# Patient Record
Sex: Female | Born: 1990
Health system: Southern US, Community
[De-identification: ages and names within clinical notes are randomized; demographics above are authoritative.]

## PROBLEM LIST (undated history)

## (undated) ENCOUNTER — Inpatient Hospital Stay (HOSPITAL_COMMUNITY): Payer: Self-pay

## (undated) DIAGNOSIS — Z789 Other specified health status: Secondary | ICD-10-CM

## (undated) HISTORY — PX: WISDOM TOOTH EXTRACTION: SHX21

---
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
## 2010-05-12 ENCOUNTER — Ambulatory Visit: Payer: Self-pay | Admitting: Nurse Practitioner

## 2010-05-12 ENCOUNTER — Inpatient Hospital Stay (HOSPITAL_COMMUNITY)
Admission: AD | Admit: 2010-05-12 | Discharge: 2010-05-12 | Payer: Self-pay | Source: Home / Self Care | Admitting: Obstetrics & Gynecology

## 2010-05-17 ENCOUNTER — Ambulatory Visit: Payer: Self-pay | Admitting: Nurse Practitioner

## 2010-05-17 ENCOUNTER — Ambulatory Visit (HOSPITAL_COMMUNITY)
Admission: RE | Admit: 2010-05-17 | Discharge: 2010-05-17 | Payer: Self-pay | Source: Home / Self Care | Admitting: Obstetrics & Gynecology

## 2010-11-29 ENCOUNTER — Inpatient Hospital Stay (HOSPITAL_COMMUNITY)
Admission: AD | Admit: 2010-11-29 | Discharge: 2010-11-29 | Disposition: A | Discharge status: Home / Self Care | Payer: Medicaid Other | Source: Ambulatory Visit | Attending: Obstetrics | Admitting: Obstetrics

## 2010-11-29 DIAGNOSIS — O99891 Other specified diseases and conditions complicating pregnancy: Secondary | ICD-10-CM | POA: Insufficient documentation

## 2010-11-29 DIAGNOSIS — N949 Unspecified condition associated with female genital organs and menstrual cycle: Secondary | ICD-10-CM | POA: Insufficient documentation

## 2010-11-29 DIAGNOSIS — J069 Acute upper respiratory infection, unspecified: Secondary | ICD-10-CM | POA: Insufficient documentation

## 2010-11-29 LAB — URINALYSIS, ROUTINE W REFLEX MICROSCOPIC
Bilirubin Urine: NEGATIVE
Hgb urine dipstick: NEGATIVE
Ketones, ur: 40 mg/dL — AB
Nitrite: NEGATIVE
Protein, ur: NEGATIVE mg/dL
Specific Gravity, Urine: 1.015 (ref 1.005–1.030)
Urine Glucose, Fasting: NEGATIVE mg/dL
Urobilinogen, UA: 2 mg/dL — ABNORMAL HIGH (ref 0.0–1.0)
pH: 7 (ref 5.0–8.0)

## 2010-11-29 LAB — WET PREP, GENITAL
Clue Cells Wet Prep HPF POC: NONE SEEN
Trich, Wet Prep: NONE SEEN
Yeast Wet Prep HPF POC: NONE SEEN

## 2010-11-30 LAB — GC/CHLAMYDIA PROBE AMP, URINE
Chlamydia, Swab/Urine, PCR: NEGATIVE
GC Probe Amp, Urine: NEGATIVE

## 2010-12-24 LAB — URINALYSIS, ROUTINE W REFLEX MICROSCOPIC
Bilirubin Urine: NEGATIVE
Glucose, UA: NEGATIVE mg/dL
Hgb urine dipstick: NEGATIVE
Ketones, ur: NEGATIVE mg/dL
Nitrite: NEGATIVE
Protein, ur: NEGATIVE mg/dL
Specific Gravity, Urine: 1.02 (ref 1.005–1.030)
Urobilinogen, UA: 2 mg/dL — ABNORMAL HIGH (ref 0.0–1.0)
pH: 7.5 (ref 5.0–8.0)

## 2010-12-24 LAB — CBC
HCT: 37.3 % (ref 36.0–46.0)
Hemoglobin: 12.3 g/dL (ref 12.0–15.0)
MCH: 29.2 pg (ref 26.0–34.0)
MCHC: 33.1 g/dL (ref 30.0–36.0)
MCV: 88.4 fL (ref 78.0–100.0)
Platelets: 236 10*3/uL (ref 150–400)
RBC: 4.22 MIL/uL (ref 3.87–5.11)
RDW: 12.7 % (ref 11.5–15.5)
WBC: 5.8 10*3/uL (ref 4.0–10.5)

## 2010-12-24 LAB — GC/CHLAMYDIA PROBE AMP, GENITAL
Chlamydia, DNA Probe: NEGATIVE
GC Probe Amp, Genital: NEGATIVE

## 2010-12-24 LAB — ABO/RH: ABO/RH(D): A POS

## 2010-12-24 LAB — URINE MICROSCOPIC-ADD ON

## 2010-12-24 LAB — URINE CULTURE
Colony Count: >=100000
Culture  Setup Time: 201108040247

## 2010-12-24 LAB — WET PREP, GENITAL
Trich, Wet Prep: NONE SEEN
Yeast Wet Prep HPF POC: NONE SEEN

## 2010-12-24 LAB — POCT PREGNANCY, URINE: Preg Test, Ur: POSITIVE

## 2010-12-24 LAB — HCG, QUANTITATIVE, PREGNANCY: hCG, Beta Chain, Quant, S: 5685 m[IU]/mL — ABNORMAL HIGH (ref <5)

## 2011-01-06 ENCOUNTER — Inpatient Hospital Stay (HOSPITAL_COMMUNITY)
Admission: AD | Admit: 2011-01-06 | Discharge: 2011-01-08 | DRG: 766 | Disposition: A | Discharge status: Home / Self Care | Payer: Medicaid Other | Source: Ambulatory Visit | Attending: Obstetrics | Admitting: Obstetrics

## 2011-01-06 DIAGNOSIS — O34219 Maternal care for unspecified type scar from previous cesarean delivery: Principal | ICD-10-CM | POA: Diagnosis present

## 2011-01-06 LAB — CBC
HCT: 35.6 % — ABNORMAL LOW (ref 36.0–46.0)
Hemoglobin: 11.5 g/dL — ABNORMAL LOW (ref 12.0–15.0)
MCH: 27.9 pg (ref 26.0–34.0)
MCHC: 32.3 g/dL (ref 30.0–36.0)
MCV: 86.4 fL (ref 78.0–100.0)
Platelets: 179 10*3/uL (ref 150–400)
RBC: 4.12 MIL/uL (ref 3.87–5.11)
RDW: 13.7 % (ref 11.5–15.5)
WBC: 8.9 10*3/uL (ref 4.0–10.5)

## 2011-01-06 LAB — URINALYSIS, ROUTINE W REFLEX MICROSCOPIC
Bilirubin Urine: NEGATIVE
Glucose, UA: NEGATIVE mg/dL
Ketones, ur: NEGATIVE mg/dL
Nitrite: NEGATIVE
Protein, ur: NEGATIVE mg/dL
Specific Gravity, Urine: 1.01 (ref 1.005–1.030)
Urobilinogen, UA: 0.2 mg/dL (ref 0.0–1.0)
pH: 6 (ref 5.0–8.0)

## 2011-01-06 LAB — URINE MICROSCOPIC-ADD ON

## 2011-01-07 LAB — CBC
HCT: 29.9 % — ABNORMAL LOW (ref 36.0–46.0)
Hemoglobin: 9.6 g/dL — ABNORMAL LOW (ref 12.0–15.0)
MCH: 27.7 pg (ref 26.0–34.0)
MCHC: 32.1 g/dL (ref 30.0–36.0)
MCV: 86.4 fL (ref 78.0–100.0)
Platelets: 150 10*3/uL (ref 150–400)
RBC: 3.46 MIL/uL — ABNORMAL LOW (ref 3.87–5.11)
RDW: 13.7 % (ref 11.5–15.5)
WBC: 11.4 10*3/uL — ABNORMAL HIGH (ref 4.0–10.5)

## 2011-01-07 LAB — RPR: RPR Ser Ql: NONREACTIVE

## 2011-01-12 NOTE — Op Note (Signed)
  NAMEKELLYJO, Lori Jackson             ACCOUNT NO.:  [id number]  MEDICAL RECORD NO.:  [id number]           PATIENT TYPE:  I  LOCATION:  9133                          [facility]  PHYSICIAN:  Kathreen Cosier, M.D.DATE OF BIRTH:  03/11/1991  DATE OF PROCEDURE: DATE OF DISCHARGE:                              OPERATIVE REPORT   PREOPERATIVE DIAGNOSIS:  Previous cesarean section at term in labor, desires repeat.  POSTOPERATIVE DIAGNOSIS:  Previous cesarean section at term in labor, desires repeat.  SURGEON:  Kathreen Cosier, MD  ANESTHESIA:  Spinal anesthesia.  PROCEDURE:  The patient placed on the operating table in supine position after the spinal administered, the abdomen was prepped and draped, bladder was emptied with Foley catheter.  Transverse suprapubic incision was made through the old scar, carried down to the rectus fascia. Fascia cleaned and incised the length of the incision.  Recti muscles retracted laterally.  Peritoneum incised longitudinally.  Transverse incision was made in the visceral peritoneum above the bladder.  Bladder was mobilized inferiorly.  Transverse lower uterine incision was made. Fluid cleared.  The patient delivered from the LOA position of a female Apgar of 9 and 9, weighing 7 pounds 6 ounces.  Placenta was fundal, removed manually and sent to Labor and Delivery.  Team was in attendance.  The uterine cavity was cleaned with dry laps.  Uterine incision was closed in one layer with continuous suture of #1 chromic. Hemostasis was satisfactory.  Bladder flap reattached with 2-0 chromic. Uterus well contracted.  Tubes and ovaries were normal.  Abdomen was closed in layers, peritoneum continuous suture of O chromic, fascia continuous suture with Dexon, skin closed with subcuticular stitch of 4- 0 Monocryl.  Blood loss was 600 mL.  The patient tolerated procedure well and taken to recovery room in good condition.     ______________________________ Kathreen Cosier, M.D.     BAM/MEDQ  D:  01/06/2011  T:  01/07/2011  Job:  [id number]  Electronically Signed by Francoise Ceo M.D. on 01/12/2011 08:12:35 AM

## 2011-01-12 NOTE — Discharge Summary (Signed)
  NAMECASADY, Lori Jackson             ACCOUNT NO.:  [id number]  MEDICAL RECORD NO.:  [id number]           PATIENT TYPE:  I  LOCATION:  9133                          [facility]  [facility]  PHYSICIAN:  Roseanna Rainbow, M.D.DATE OF BIRTH:  02/12/1991  DATE OF ADMISSION:  01/06/2011 DATE OF DISCHARGE:  01/08/2011                              DISCHARGE SUMMARY   CHIEF COMPLAINT:  The patient is a 20 year old gravida 2, para 0-1-0-1 with an EDC of April 9 with a history of previous cesarean delivery, complaining of contractions.  HISTORY OF PRESENT ILLNESS:  Please see the above.  The patient has a history of previous cesarean delivery for a placental abruption.  She declines a trial of labor.  PAST MEDICAL HISTORY:  She denies.  PAST GYN HISTORY:  Noncontributory.  PAST OB HISTORY:  Please see the above.  FAMILY HISTORY:  Noncontributory.  SOCIAL HISTORY:  She denies any tobacco, ethanol, or drug use.  ALLERGIES:  CODEINE.  MEDICATIONS:  Please see the medication reconciliation form.  REVIEW OF SYSTEMS:  GU:  Please see the above.  PHYSICAL EXAMINATION:  VITAL SIGNS:  Stable, afebrile. GENERAL:  Moderate distress. HEAD, EYES, EARS, NOSE AND THROAT:  Normocephalic and atraumatic. NECK:  Supple. LUNGS:  Clear to auscultation bilaterally. HEART:  Regular rate and rhythm. ABDOMEN:  Gravid. PELVIC:  Per the RN. EXTREMITIES:  No clubbing, cyanosis, or edema. SKIN:  Without rash. NEUROLOGIC:  Nonfocal.  ASSESSMENT:  History of a previous cesarean delivery, intrauterine pregnancy at term, threatened labor, desires a repeat cesarean delivery.  PLAN:  Admission, repeat cesarean delivery.  HOSPITAL COURSE:  The patient was admitted and underwent repeat cesarean delivery.  Please see the dictated operative summary for further details.  Postoperative day #1, hemoglobin was 9.6, preoperative hemoglobin was 11.5.  The remainder of the patient's hospital course  was uneventful.  DISCHARGE DIAGNOSES:  Intrauterine pregnancy at term, threatened labor, history of previous cesarean delivery, declines trial of labor.  PROCEDURE:  Repeat cesarean delivery.  CONDITION:  Good.  DIET:  Regular.  ACTIVITY:  Pelvic rest, progressive activity.  MEDICATIONS:  Percocet 5/325 1-2 tablets every 6 hours as needed and prenatal vitamins.  DISPOSITION:  The patient is to follow up in the office with Dr. Gaynell Face.     Roseanna Rainbow, M.D.     [name]  D:  01/08/2011  T:  01/09/2011  Job:  [id number]  cc:   Kathreen Cosier, M.D. Fax: [phone]  Electronically Signed by Antionette Char M.D. on 01/12/2011 10:12:55 PM

## 2011-01-30 ENCOUNTER — Inpatient Hospital Stay (INDEPENDENT_AMBULATORY_CARE_PROVIDER_SITE_OTHER)
Admission: RE | Admit: 2011-01-30 | Discharge: 2011-01-30 | Disposition: A | Discharge status: Home / Self Care | Payer: Medicaid Other | Source: Ambulatory Visit | Attending: Emergency Medicine | Admitting: Emergency Medicine

## 2011-01-30 DIAGNOSIS — J069 Acute upper respiratory infection, unspecified: Secondary | ICD-10-CM

## 2011-01-30 LAB — POCT INFECTIOUS MONO SCREEN: Mono Screen: NEGATIVE

## 2011-01-30 LAB — POCT RAPID STREP A (OFFICE): Streptococcus, Group A Screen (Direct): NEGATIVE

## 2011-02-25 NOTE — Op Note (Signed)
NAMECAYLOR, CERINO             ACCOUNT NO.:  [id number]   MEDICAL RECORD NO.:  [id number]          PATIENT TYPE:  MAT   LOCATION:  MATC                          [facility]:  [facility]   PHYSICIAN:  Naima A. Dillard, M.D. DATE OF BIRTH:  January 19, 1991   DATE OF PROCEDURE:  01/20/2007  DATE OF DISCHARGE:                               OPERATIVE REPORT   PREOPERATIVE DIAGNOSIS:  Intrauterine pregnancy at 29-6/7 weeks with  placental abruption.   POSTOPERATIVE DIAGNOSIS:  Intrauterine pregnancy at 29-6/7 weeks with  placental abruption.   PROCEDURE:  Primary low transverse cesarean section.   SURGEON:  Dr. Normand Sloop   ASSISTANT:  Philipp Deputy, C.N.M.   ANESTHESIA:  Spinal.   ESTIMATED BLOOD LOSS:  1 L.   URINE OUTPUT:  75 mL clear urine at the end of the procedure.   IV FLUIDS:  4 L.   COMPLICATIONS:  None.  Placenta was sent to pathology.   FINDINGS:  A female infant, vertex presentation with nuchal cord x1,  clear fluid.  Apgars were 2 and 7.  Arterial pH was 6.99, and venous pH  was 7.05.  There was large clot along the placenta and inside the uterus  consistent with placenta abruption.  There was normal-appearing uterus,  tubes, and ovaries and abdominal anatomy.   PROCEDURE IN DETAIL:  The patient was taken to the operating room where  she was given spinal anesthesia, placed in the dorsal supine position  and then prepped and draped in a normal sterile fashion.  A Foley  catheter had been placed already before she was given a spinal.  Once  the patient was tested and her spinal anesthesia was found to be  adequate, a Pfannenstiel skin incision was made with a scalpel and  carried down to the fascia using Bovie cautery.  The fascia was incised  in the midline and extended bilaterally using Mayo scissors.  Kochers x2  were placed in the superior aspect of the fascia which was dissected off  the rectus muscle both bluntly and sharply.  The inferior aspect of the  fascia was dissected  in a similar fashion.  The muscles were separated  in the midline; peritoneum was identified, tented up, and entered  sharply and extended superiorly and inferiorly with good visualization  of bowel and bladder.  Vesicouterine peritoneum was identified, tented  up and entered sharply and extended bilaterally.  The bladder blade was  inserted.  A primary lower transverse cesarean section primary low  transverse uterine incision was made with a scalpel and extended  bluntly.  Clear fluid noted once the amniotic sac was interrupted with  Allis clamps.  The head was delivered without difficulty.  The nuchal  cord x1 was reduced.  The body was delivered.  The cord was clamped and  cut and handed over the awaiting pediatricians.  Cord blood and arterial  and venous pH were collected.  Would expect to have large clots  elongating the uterus and several large clots extending from the uterus.  The uterus was cleared of all clot and debris.  Uterine incision was  repaired with  0 Vicryl in a running lock fashion.  A second layer of 0  Vicryl was used to imbricate the uterus. Irrigation was done.  All areas  were noted to be hemostatic.  The patient had normal-appearing tubes and  ovaries.  The peritoneum was closed with 0 chromic in a running fashion.  The fascia was closed with 0 Vicryl in a running fashion.  The skin was  reapproximated with 3-0 Monocryl in a subcuticular fashion.  Sponge,  lap, and needle counts were correct.  The patient went to recovery room  in stable condition.      Naima A. Normand Sloop, M.D.  Electronically Signed     NAD/MEDQ  D:  01/20/2007  T:  01/20/2007  Job:  [id number]

## 2011-02-25 NOTE — H&P (Signed)
Lori Jackson, Lori Jackson             ACCOUNT NO.:  [id number]   MEDICAL RECORD NO.:  [id number]          PATIENT TYPE:  MAT   LOCATION:  MATC                          [facility]   PHYSICIAN:  Naima A. Dillard, M.D. DATE OF BIRTH:  12-13-1990   DATE OF ADMISSION:  01/20/2007  DATE OF DISCHARGE:                              HISTORY & PHYSICAL   Lori Jackson is a 20 year old primigravida at 29-6/7 weeks who presents  unannounced with lower abdominal pain since 1:00 a.m.  She reports  intercourse yesterday at 7:00 p.m.  She reports positive fetal movement.  No leaking, no bleeding.  Her pregnancy has been followed by the [facility] service has been remarkable for:  1. Teen.  2. First trimester gonorrhea.  3. First trimester UTI.   She had one episode of vomiting since arriving at maternity admissions.  Her prenatal labs were collected on October 18, 2006.  Hemoglobin 11.8,  hematocrit 34.0, platelets 263,000.  Blood type A+, antibody negative.  Sickle cell trait negative, rubella immune, RPR nonreactive, hepatitis B  surface antigen negative, HIV nonreactive.  Cystic fibrosis negative.  Gonorrhea and chlamydia on this date were both negative, although she  had a positive GC on August 02, 2006 that was treated.   HISTORY OF PRESENT PREGNANCY:  The patient presented for care at [facility] on October 18, 2006 at 16-1/[redacted] weeks gestation.  She had  initially been seen by a different practice but had to leave and  transfer due to insurance issues.  Pregnancy ultrasonography at 19  weeks' gestation shows growth consistent with previous dating,  confirming [facility] of April 01, 2007.  All anatomy was seen.  She had a lump  in her right breast at the 6 o'clock position that she reports being  there for several years.  It was diagnosed as a cyst, but she did not  have an ultrasound Jackson.  She was referred for a breast ultrasound.  At  this point, no records are available with  ultrasound showed.  The rest  of her prenatal care has been unremarkable.   OBSTETRICAL HISTORY:  She is a primigravida.   PAST MEDICAL HISTORY:  Shows no medication allergies.  She experienced  menarche at the age of 57 with 28-day cycles.  She had gonorrhea in the  first trimester with follow-up test of cure negative.  She has taken  oral contraceptives in the past.  She reports having had the usual  childhood illnesses.  She had a UTI in the first trimester.  She had  left hand sprain in the past.   SURGICAL HISTORY:  Is remarkable for wisdom teeth extraction at the age  of 51.   FAMILY MEDICAL HISTORY:  Hypertension on the maternal side, diabetes on  the maternal side, paternal grandfather with lung cancer.  Genetic  history is negative.   SOCIAL HISTORY:  The patient is single.  Father of the baby is involved.  His name is Apolinar Junes.  They are of the [place] faith.  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  Father of  the baby is in 12th grade as  a Consulting civil engineer.  They deny any alcohol, tobacco or illicit drug use in the  pregnancy.   OBJECTIVE:  VITAL SIGNS:  Stable.  She is afebrile.  HEENT:  Is grossly within normal limits.  CHEST:  Is clear to auscultation.  HEART:  Regular rate and rhythm.  ABDOMEN:  Is gravid in contour, soft and nontender.  Fetal monitoring  shows reassuring monitoring with average variability and positive  uterine irritability.  PELVIC EXAM:  Shows cervix closed, 30% thinned out, vertex -3.  Gonorrhea, chlamydia and group B strep have all been collected.  EXTREMITIES:  Are normal.   Urinalysis shows specific gravity greater than 1.030, small hemoglobin,  few bacteria and 100 of protein.  Ultrasound shows 11 x 11 x 8.5-cm  abruption at the edge of the placenta.  [facility] laboratories are pending   ASSESSMENT:  1. Intrauterine pregnancy at 29-6/7 weeks.  2. Placental abruption.  3. Proteinuria with pending [facility] laboratories.   PLAN:  Is per Dr.  Normand Sloop to keep the patient n.p.o. and prepare for the  OR.      Cam Hai, C.N.M.      Naima A. Normand Sloop, M.D.  Electronically Signed    KS/MEDQ  D:  01/20/2007  T:  01/20/2007  Job:  [id number]

## 2011-02-25 NOTE — H&P (Signed)
NAMETERIANA, DANKER             ACCOUNT NO.:  [id number]   MEDICAL RECORD NO.:  [id number]          PATIENT TYPE:  MAT   LOCATION:  MATC                          [facility]   PHYSICIAN:  Naima A. Dillard, M.D. DATE OF BIRTH:  08-01-1991   DATE OF ADMISSION:  01/20/2007  DATE OF DISCHARGE:                              HISTORY & PHYSICAL   The patient is a 20 year old African-American female, gravida 1, para 0,  at 29-6/7 weeks with a large placental abruption. She presented early  this morning complaining of cramping after intercourse. There was no  leakage of fluid and no vaginal bleeding until after her ultrasound. The  patient did not have any drug use or abdominal trauma. She denied any  headache, blurry vision or right upper quadrant pain. The patient is  afebrile with stable vital signs. NST was 130s with minor long-term  variability and occasional decelerations to the 120s with good return.  An occasional maternal heart rate was picked up. The toco was small,  high frequency, low amplitude contractions about every minute.   The patient was in no acute distress. Her heart had regular rate and  rhythm. Lungs were clear to auscultation bilaterally. Abdomen was  gravid, soft, and mildly diffusely tender. Genitourinary has some mild  vaginal bleeding. Extremities had no clubbing, cyanosis, or edema.   PERTINENT LABORATORY DATA:  Her urine was greater than 300 mg of  protein; that was a catheter UA. LDH was 263. Urine acid 5.7. BUN and  creatinine 9 and 0.67. Glucose was 92. AST is 27, ALT is 25, total  bilirubin is 0.4. On ultrasound, fetal heart tones were in the 109  range. She had a posterior placenta with an abruption measuring 11 x 11  x 8.5 cm.   ASSESSMENT:  Intrauterine pregnancy at 29-6/7 weeks with large placental  abruption, recent vaginal bleeding, occasional fetal decelerations and  proteinuria.   The plan is a cesarean section delivery because of large  abruption and  occasional fetal decelerations. I spoke with Dr. __________ who is a  Holiday representative and agreed with the plan of care. The patient understood  the risks of the bleeding, infection, and damage to internal organs such  as bowel and bladder and major blood vessels. We will check a urine drug  screen and rule out a drug-induced abruption. We will check a 24-hour  urine protein to rule out proteinuria. The patient does not meet the  criteria for preeclampsia because her blood pressures are normal. Typed  and crossed the patient for 4 units of packed red blood cells.      Naima A. Normand Sloop, M.D.  Electronically Signed     NAD/MEDQ  D:  01/20/2007  T:  01/20/2007  Job:  [id number]

## 2011-02-25 NOTE — Discharge Summary (Signed)
NAMECAMARY, Lori Jackson             ACCOUNT NO.:  [id number]   MEDICAL RECORD NO.:  [id number]          PATIENT TYPE:  INP   LOCATION:  9320                          [facility]ILITY:  [facility]   PHYSICIAN:  Janine Limbo, M.D.DATE OF BIRTH:  1991-04-27   DATE OF ADMISSION:  01/20/2007  DATE OF DISCHARGE:  01/23/2007                               DISCHARGE SUMMARY   ADMISSION DIAGNOSES:  1. Intrauterine pregnancy at 29-6/7 weeks.  2. Placental abruption.  3. Proteinuria.   PROCEDURE:  Primary low transverse cesarean section.   DISCHARGE DIAGNOSIS:  1. Intrauterine pregnancy at 29-6/7 weeks delivered.  2. Placental abruption.  3. Proteinuria.  4. Positive chlamydia.  5. Severe anemia.   HISTORY OF PRESENT ILLNESS:  Lori Jackson is a 20 year old gravida 1,  para 0 who presented at 29-6/7 weeks with a large placental abruption  and proteinuria. Placental abruption was identified by ultrasound  examination and decision was made to proceed with primary low transverse  cesarean section.  This was done on January 20, 2007 with Dr. Normand Sloop as  Careers adviser. The patient gave birth to a 2 pounds 12.9 ounce female infant  with Apgar scores of two at 1 minute, seven at 5 minutes. Arterial cord  pH was 6.99, venous pH was 7.05.  Baby is stable in the postoperative  period in the NICU. The patient has done well postoperatively.  Her  postoperative period was significant for severe anemia.  Her hemoglobin  on the first postoperative day was noted to be 5.3 with platelets  87,000.  Her orthostatic vital signs remained stable.  The patient has  been asymptomatic with her anemia and she has declined blood  transfusion. Her hemoglobin has been watched at least daily and has  stabilized on her third postoperative day at 5.6 with a platelet count  of 127.  The patient remains asymptomatic and again declines transfusion  prior to discharge.  Her incision is clean, dry and intact.  Her vital  signs are stable.   She is afebrile and on this her third postoperative  day she is judged to be in satisfactory condition for discharge.  Please  add that cervical culture done on admission was noted to find a positive  chlamydia. The patient was treated with Zithromax 1 gram p.o. and was  notified that her partner would need to be treated as well as the NICU  notified.  Discharge instructions are per [facility] handout.   DISCHARGE MEDICATIONS:  1. Motrin 600 mg p.o. q.6 h p.r.n. pain.  2. Tylox one to two p.o. q.3-4 h p.r.n. pain.  3. Prenatal vitamins.  4. Iron supplement twice daily.  5. The patient will receive Depo-Provera 150 mg IM prior to discharge      for contraception and she will follow up at the office of [facility] in 6      weeks or p.r.n. Arrangements will be made for the patient to follow      up with urology secondary to proteinuria with a total protein on 24-      hour urine collection noted to be 234.  Lori Jackson, C.N.M.      Janine Limbo, M.D.  Electronically Signed    SDM/MEDQ  D:  01/23/2007  T:  01/23/2007  Job:  [id number]

## 2011-05-16 ENCOUNTER — Encounter (HOSPITAL_COMMUNITY): Admission: RE | Payer: Self-pay | Source: Ambulatory Visit

## 2011-05-16 ENCOUNTER — Ambulatory Visit (HOSPITAL_COMMUNITY): Admission: RE | Admit: 2011-05-16 | Payer: Self-pay | Source: Ambulatory Visit | Admitting: Obstetrics and Gynecology

## 2011-05-16 SURGERY — HYSTEROSCOPY WITH NOVASURE
Anesthesia: Choice

## 2012-04-17 ENCOUNTER — Encounter (HOSPITAL_COMMUNITY): Payer: Self-pay | Admitting: *Deleted

## 2012-04-17 ENCOUNTER — Inpatient Hospital Stay (HOSPITAL_COMMUNITY)
Admission: AD | Admit: 2012-04-17 | Discharge: 2012-04-18 | Disposition: A | Discharge status: Home / Self Care | Payer: Medicaid Other | Source: Ambulatory Visit | Attending: Obstetrics and Gynecology | Admitting: Obstetrics and Gynecology

## 2012-04-17 DIAGNOSIS — O4690 Antepartum hemorrhage, unspecified, unspecified trimester: Secondary | ICD-10-CM

## 2012-04-17 DIAGNOSIS — N888 Other specified noninflammatory disorders of cervix uteri: Secondary | ICD-10-CM

## 2012-04-17 DIAGNOSIS — O26859 Spotting complicating pregnancy, unspecified trimester: Secondary | ICD-10-CM | POA: Insufficient documentation

## 2012-04-17 LAB — WET PREP, GENITAL
Trich, Wet Prep: NONE SEEN
Yeast Wet Prep HPF POC: NONE SEEN

## 2012-04-17 LAB — URINALYSIS, ROUTINE W REFLEX MICROSCOPIC
Bilirubin Urine: NEGATIVE
Glucose, UA: NEGATIVE mg/dL
Hgb urine dipstick: NEGATIVE
Ketones, ur: NEGATIVE mg/dL
Leukocytes, UA: NEGATIVE
Nitrite: NEGATIVE
Protein, ur: NEGATIVE mg/dL
Specific Gravity, Urine: 1.01 (ref 1.005–1.030)
Urobilinogen, UA: 0.2 mg/dL (ref 0.0–1.0)
pH: 6 (ref 5.0–8.0)

## 2012-04-17 NOTE — Discharge Instructions (Signed)
No sex if you are having bleeding Begin prenatal care as soon as possible Return if you are having heavy vaginal bleeding Drink at least 8 8-oz glasses of water every day. Take Tylenol 325 mg 2 tablets by mouth every 4 hours if needed for pain.

## 2012-04-17 NOTE — MAU Note (Signed)
Pt G3 P2, at 20wks reports small amt of bleeding x 3 days.  Denies cramping or problems with pregnancy.

## 2012-04-17 NOTE — MAU Provider Note (Signed)
History     CSN: [id number]  Arrival date and time: 04/17/12 2223   First Provider Initiated Contact with Patient 04/17/12 2323      Chief Complaint  Patient presents with  . Vaginal Bleeding   HPI Lori Jackson 21 y.o. [redacted]w[redacted]d Comes to MAU with spotting since Sunday.  No abdominal pain.  Last intercourse was Saturday.  Feeling the baby move.  Has not yet started prenatal care.  Plans to make an appointment with Dr. Gaynell Face when paperwork for pending medicaid comes this week.    OB History    Grav Para Term Preterm Abortions TAB SAB Ect Mult Living   3 2 1 1      2       History reviewed. No pertinent past medical history.  Past Surgical History  Procedure Date  . Cesarean section   . Wisdom tooth extraction     Family History  Problem Relation Age of Onset  . Diabetes Paternal Grandmother   . Hypertension Paternal Grandmother     History  Substance Use Topics  . Smoking status: Never Smoker   . Smokeless tobacco: Not on file  . Alcohol Use: No    Allergies:  Allergies  Allergen Reactions  . Codeine Rash    Prescriptions prior to admission  Medication Sig Dispense Refill  . Prenatal Vit-Fe Fumarate-FA (MULTIVITAMIN-PRENATAL) 27-0.8 MG TABS Take 1 tablet by mouth daily.        Review of Systems  Constitutional: Negative for fever.  Gastrointestinal: Negative for abdominal pain.  Genitourinary: Negative for dysuria.       Vaginal spotting   Physical Exam   Blood pressure 110/59, pulse 91, temperature 98.7 F (37.1 C), temperature source Oral, resp. rate 16, height 5\' 5"  (1.651 m), weight 156 lb (70.761 kg), last menstrual period 11/24/2011.  Physical Exam  Nursing note and vitals reviewed. Constitutional: She is oriented to person, place, and time. She appears well-developed and well-nourished.  HENT:  Head: Normocephalic.  Eyes: EOM are normal.  Neck: Neck supple.  GI: Soft. There is no tenderness.       FHT 144 with doppler  Genitourinary:         Speculum exam: Vagina - Mod amount of creamy, pink discharge, no odor Cervix - Contact bleeding noted when external cervix touched with a swab Bimanual exam: Cervix closed Uterus non tender, 20w size Adnexa non tender, no masses bilaterally GC/Chlam, wet prep done Chaperone present for exam.  Musculoskeletal: Normal range of motion.  Neurological: She is alert and oriented to person, place, and time.  Skin: Skin is warm and dry.  Psychiatric: She has a normal mood and affect.    MAU Course  Procedures  MDM Results for orders placed during the hospital encounter of 04/17/12 (from the past 24 hour(s))  URINALYSIS, ROUTINE W REFLEX MICROSCOPIC     Status: Normal   Collection Time   04/17/12 10:42 PM      Component Value Range   Color, Urine YELLOW  YELLOW   APPearance CLEAR  CLEAR   Specific Gravity, Urine 1.010  1.005 - 1.030   pH 6.0  5.0 - 8.0   Glucose, UA NEGATIVE  NEGATIVE mg/dL   Hgb urine dipstick NEGATIVE  NEGATIVE   Bilirubin Urine NEGATIVE  NEGATIVE   Ketones, ur NEGATIVE  NEGATIVE mg/dL   Protein, ur NEGATIVE  NEGATIVE mg/dL   Urobilinogen, UA 0.2  0.0 - 1.0 mg/dL   Nitrite NEGATIVE  NEGATIVE   Leukocytes,  UA NEGATIVE  NEGATIVE  WET PREP, GENITAL     Status: Abnormal   Collection Time   04/17/12 11:28 PM      Component Value Range   Yeast Wet Prep HPF POC NONE SEEN  NONE SEEN   Trich, Wet Prep NONE SEEN  NONE SEEN   Clue Cells Wet Prep HPF POC MODERATE (*) NONE SEEN   WBC, Wet Prep HPF POC FEW (*) NONE SEEN   Blood Type A positive  Assessment and Plan  20 week pregnancy Friable cervix  Plan No sex if you are having bleeding Begin prenatal care as soon as possible Return if you are having heavy vaginal bleeding Drink at least 8 8-oz glasses of water every day. Take Tylenol 325 mg 2 tablets by mouth every 4 hours if needed for pain.  Lori Jackson 04/17/2012, 11:48 PM

## 2012-04-19 LAB — GC/CHLAMYDIA PROBE AMP, GENITAL
Chlamydia, DNA Probe: NEGATIVE
GC Probe Amp, Genital: NEGATIVE

## 2012-04-19 NOTE — MAU Provider Note (Signed)
Agree with above note.  Anael Rosch 04/19/2012 10:20 AM   

## 2012-05-10 ENCOUNTER — Other Ambulatory Visit (HOSPITAL_COMMUNITY): Payer: Self-pay | Admitting: Obstetrics

## 2012-05-10 ENCOUNTER — Other Ambulatory Visit: Payer: Self-pay | Admitting: Obstetrics

## 2012-05-10 DIAGNOSIS — IMO0002 Reserved for concepts with insufficient information to code with codable children: Secondary | ICD-10-CM

## 2012-05-10 DIAGNOSIS — Z0489 Encounter for examination and observation for other specified reasons: Secondary | ICD-10-CM

## 2012-05-10 DIAGNOSIS — N63 Unspecified lump in unspecified breast: Secondary | ICD-10-CM

## 2012-05-15 ENCOUNTER — Ambulatory Visit (HOSPITAL_COMMUNITY)
Admission: RE | Admit: 2012-05-15 | Discharge: 2012-05-15 | Disposition: A | Discharge status: Home / Self Care | Payer: Medicaid Other | Source: Ambulatory Visit | Attending: Obstetrics | Admitting: Obstetrics

## 2012-05-15 DIAGNOSIS — O358XX0 Maternal care for other (suspected) fetal abnormality and damage, not applicable or unspecified: Secondary | ICD-10-CM | POA: Insufficient documentation

## 2012-05-15 DIAGNOSIS — O34219 Maternal care for unspecified type scar from previous cesarean delivery: Secondary | ICD-10-CM | POA: Insufficient documentation

## 2012-05-15 DIAGNOSIS — IMO0002 Reserved for concepts with insufficient information to code with codable children: Secondary | ICD-10-CM

## 2012-05-15 DIAGNOSIS — Z8751 Personal history of pre-term labor: Secondary | ICD-10-CM | POA: Insufficient documentation

## 2012-05-15 DIAGNOSIS — Z363 Encounter for antenatal screening for malformations: Secondary | ICD-10-CM | POA: Insufficient documentation

## 2012-05-15 DIAGNOSIS — Z0489 Encounter for examination and observation for other specified reasons: Secondary | ICD-10-CM

## 2012-05-15 DIAGNOSIS — Z1389 Encounter for screening for other disorder: Secondary | ICD-10-CM | POA: Insufficient documentation

## 2012-05-16 ENCOUNTER — Other Ambulatory Visit: Payer: Medicaid Other

## 2012-05-17 ENCOUNTER — Ambulatory Visit
Admission: RE | Admit: 2012-05-17 | Discharge: 2012-05-17 | Disposition: A | Discharge status: Home / Self Care | Payer: Medicaid Other | Source: Ambulatory Visit | Attending: Obstetrics | Admitting: Obstetrics

## 2012-05-17 DIAGNOSIS — N63 Unspecified lump in unspecified breast: Secondary | ICD-10-CM

## 2012-07-25 ENCOUNTER — Inpatient Hospital Stay (HOSPITAL_COMMUNITY)
Admission: AD | Admit: 2012-07-25 | Discharge: 2012-07-25 | Disposition: A | Discharge status: Home / Self Care | Payer: Self-pay | Source: Ambulatory Visit | Attending: Obstetrics | Admitting: Obstetrics

## 2012-07-25 ENCOUNTER — Encounter (HOSPITAL_COMMUNITY): Payer: Self-pay | Admitting: *Deleted

## 2012-07-25 DIAGNOSIS — R109 Unspecified abdominal pain: Secondary | ICD-10-CM | POA: Insufficient documentation

## 2012-07-25 DIAGNOSIS — O99891 Other specified diseases and conditions complicating pregnancy: Secondary | ICD-10-CM | POA: Insufficient documentation

## 2012-07-25 HISTORY — DX: Other specified health status: Z78.9

## 2012-07-25 MED ORDER — TERBUTALINE SULFATE 1 MG/ML IJ SOLN
0.2500 mg | Freq: Once | INTRAMUSCULAR | Status: AC
  Administered 2012-07-25: 0.25 mg via SUBCUTANEOUS
  Filled 2012-07-25: qty 1

## 2012-07-25 MED ORDER — NIFEDIPINE ER OSMOTIC RELEASE 60 MG PO TB24: 60.0000 mg | ORAL_TABLET | Freq: Every day | ORAL | Status: DC

## 2012-07-25 MED ORDER — ALUM & MAG HYDROXIDE-SIMETH 200-200-20 MG/5ML PO SUSP
30.0000 mL | Freq: Once | ORAL | Status: AC
  Administered 2012-07-25: 30 mL via ORAL
  Filled 2012-07-25: qty 30

## 2012-07-25 NOTE — MAU Provider Note (Signed)
History     CSN: [id number]  Arrival date and time: 07/25/12 0147   First Provider Initiated Contact with Patient 07/25/12 0240      Chief Complaint  Patient presents with  . Abdominal Pain   HPI Lori Jackson is a 21 y.o. female @ [redacted]w[redacted]d gestation who presents to MAU with abdominal pain. The pain woke the patient at about 11 pm. She rates the pain as 8/10. The pain is located in the lower abdomen and radiates to the lower back. She denies leaking of fluid or bleeding. The history was provided by the patient.   OB History    Grav Para Term Preterm Abortions TAB SAB Ect Mult Living   3 2 1 1      2       Past Medical History  Diagnosis Date  . No pertinent past medical history     Past Surgical History  Procedure Date  . Cesarean section   . Wisdom tooth extraction     Family History  Problem Relation Age of Onset  . Diabetes Paternal Grandmother   . Hypertension Paternal Grandmother     History  Substance Use Topics  . Smoking status: Never Smoker   . Smokeless tobacco: Not on file  . Alcohol Use: No    Allergies:  Allergies  Allergen Reactions  . Codeine Rash    Prescriptions prior to admission  Medication Sig Dispense Refill  . Prenatal Vit-Fe Fumarate-FA (MULTIVITAMIN-PRENATAL) 27-0.8 MG TABS Take 1 tablet by mouth daily.        Review of Systems  Constitutional: Negative for fever and chills.  HENT: Negative for ear pain, nosebleeds, congestion, sore throat and neck pain.   Eyes: Negative for blurred vision, double vision, photophobia and pain.  Respiratory: Negative for cough, shortness of breath and wheezing.   Cardiovascular: Negative for chest pain, palpitations and leg swelling.  Gastrointestinal: Positive for heartburn and abdominal pain. Negative for nausea, vomiting, diarrhea and constipation.  Genitourinary: Positive for frequency. Negative for dysuria and urgency.  Musculoskeletal: Positive for back pain. Negative for myalgias.  Skin:  Negative for itching and rash.  Neurological: Negative for dizziness, sensory change, speech change, seizures, weakness and headaches.  Endo/Heme/Allergies: Does not bruise/bleed easily.  Psychiatric/Behavioral: Negative for depression. The patient is not nervous/anxious and does not have insomnia.    Physical Exam   Blood pressure 108/56, pulse 96, temperature 98.2 F (36.8 C), temperature source Oral, resp. rate 20, height 5\' 4"  (1.626 m), weight 160 lb 4 oz (72.689 kg), last menstrual period 11/24/2011.  Physical Exam  Nursing note and vitals reviewed. Constitutional: She is oriented to person, place, and time. She appears well-developed and well-nourished. No distress.  HENT:  Head: Normocephalic and atraumatic.  Eyes: EOM are normal.  Neck: Neck supple.  Cardiovascular: Normal rate.   Respiratory: Effort normal.  GI: Soft. There is tenderness in the epigastric area.  Genitourinary:       Dilation: 1 Effacement (%): 50 Cervical Position: Posterior Station: -2 Exam by:: Weston,RN  Musculoskeletal: Normal range of motion.  Neurological: She is alert and oriented to person, place, and time.  Skin: Skin is warm and dry.  Psychiatric: She has a normal mood and affect. Her behavior is normal. Judgment and thought content normal.   EFM: Baseline 135, accelerations, no decelerations, reactive tracing. Contractions every 3 to 5 minutes.  MAU Course: discussed with Dr. Gaynell Face will give terb and continue to monitor.  If contractions stop will d/c home  with procardia.  Procedures Medical screening exam complete. RN will call Dr. Gaynell Face for up date and to receive any further orders. MDM Paz Winsett, RN, FNP, [facility] 07/25/2012, 2:44 AM

## 2012-07-25 NOTE — Progress Notes (Signed)
Pt states pain is in her back like someone is stabbing her

## 2012-07-25 NOTE — MAU Note (Signed)
PT ALSO C/O HEARTBURN WHEN IN TRIAGE AND ASKED FOR MED.

## 2012-07-25 NOTE — MAU Note (Signed)
pts states she has been having contractions since 2300.

## 2012-07-25 NOTE — MAU Note (Signed)
PT SAYS HER LOWER BACK AND LOWER ABD STARTED HURTING AT 2300- HAS GOTTEN WORSE.      DENIES HSV AND MRSA.

## 2012-07-31 ENCOUNTER — Other Ambulatory Visit: Payer: Self-pay | Admitting: Obstetrics

## 2012-08-09 ENCOUNTER — Encounter (HOSPITAL_COMMUNITY): Payer: Self-pay | Admitting: Pharmacist

## 2012-08-14 ENCOUNTER — Encounter (HOSPITAL_COMMUNITY): Payer: Self-pay | Admitting: Anesthesiology

## 2012-08-14 ENCOUNTER — Encounter (HOSPITAL_COMMUNITY)
Admission: AD | Disposition: A | Discharge status: Home / Self Care | Payer: Self-pay | Source: Ambulatory Visit | Attending: Obstetrics

## 2012-08-14 ENCOUNTER — Encounter (HOSPITAL_COMMUNITY): Payer: Self-pay | Admitting: *Deleted

## 2012-08-14 ENCOUNTER — Inpatient Hospital Stay (HOSPITAL_COMMUNITY)
Admission: AD | Admit: 2012-08-14 | Discharge: 2012-08-17 | DRG: 766 | Disposition: A | Discharge status: Home / Self Care | Payer: Medicaid Other | Source: Ambulatory Visit | Attending: Obstetrics | Admitting: Obstetrics

## 2012-08-14 ENCOUNTER — Inpatient Hospital Stay (HOSPITAL_COMMUNITY): Payer: Medicaid Other | Admitting: Anesthesiology

## 2012-08-14 DIAGNOSIS — O34219 Maternal care for unspecified type scar from previous cesarean delivery: Principal | ICD-10-CM | POA: Diagnosis present

## 2012-08-14 DIAGNOSIS — Z302 Encounter for sterilization: Secondary | ICD-10-CM

## 2012-08-14 DIAGNOSIS — Z98891 History of uterine scar from previous surgery: Secondary | ICD-10-CM

## 2012-08-14 LAB — CBC
HCT: 33.7 % — ABNORMAL LOW (ref 36.0–46.0)
Hemoglobin: 11 g/dL — ABNORMAL LOW (ref 12.0–15.0)
MCH: 27.4 pg (ref 26.0–34.0)
MCHC: 32.6 g/dL (ref 30.0–36.0)
MCV: 83.8 fL (ref 78.0–100.0)
Platelets: 145 K/uL — ABNORMAL LOW (ref 150–400)
RBC: 4.02 MIL/uL (ref 3.87–5.11)
RDW: 14.1 % (ref 11.5–15.5)
WBC: 9 K/uL (ref 4.0–10.5)

## 2012-08-14 LAB — SYPHILIS: RPR W/REFLEX TO RPR TITER AND TREPONEMAL ANTIBODIES, TRADITIONAL SCREENING AND DIAGNOSIS ALGORITHM: RPR Ser Ql: NONREACTIVE

## 2012-08-14 LAB — POCT FERN TEST: POCT Fern Test: POSITIVE

## 2012-08-14 SURGERY — Surgical Case
Anesthesia: Spinal | Site: Abdomen | Wound class: Clean Contaminated

## 2012-08-14 MED ORDER — LACTATED RINGERS IV SOLN
INTRAVENOUS | Status: DC | PRN
  Administered 2012-08-14 (×3): via INTRAVENOUS

## 2012-08-14 MED ORDER — WITCH HAZEL-GLYCERIN EX PADS: 1.0000 "application " | MEDICATED_PAD | CUTANEOUS | Status: DC | PRN

## 2012-08-14 MED ORDER — NALBUPHINE HCL 10 MG/ML IJ SOLN
5.0000 mg | INTRAMUSCULAR | Status: DC | PRN
Start: 2012-08-14 — End: 2012-08-17
  Administered 2012-08-14: 10 mg via INTRAVENOUS
  Filled 2012-08-14: qty 1

## 2012-08-14 MED ORDER — LACTATED RINGERS IV SOLN
INTRAVENOUS | Status: DC
  Administered 2012-08-14: 14:00:00 via INTRAVENOUS

## 2012-08-14 MED ORDER — ONDANSETRON 8 MG/NS 50 ML IVPB
8.0000 mg | Freq: Once | INTRAVENOUS | Status: AC
  Administered 2012-08-14: 8 mg via INTRAVENOUS
  Filled 2012-08-14: qty 8

## 2012-08-14 MED ORDER — KETOROLAC TROMETHAMINE 30 MG/ML IJ SOLN
INTRAMUSCULAR | Status: AC
  Filled 2012-08-14: qty 1

## 2012-08-14 MED ORDER — EPHEDRINE SULFATE 50 MG/ML IJ SOLN
INTRAMUSCULAR | Status: DC | PRN
  Administered 2012-08-14: 10 mg via INTRAVENOUS

## 2012-08-14 MED ORDER — SIMETHICONE 80 MG PO CHEW
80.0000 mg | CHEWABLE_TABLET | Freq: Three times a day (TID) | ORAL | Status: DC
  Administered 2012-08-14 – 2012-08-16 (×11): 80 mg via ORAL

## 2012-08-14 MED ORDER — FENTANYL CITRATE 0.05 MG/ML IJ SOLN: 25.0000 ug | INTRAMUSCULAR | Status: DC | PRN

## 2012-08-14 MED ORDER — ATROPINE SULFATE 0.4 MG/ML IJ SOLN
INTRAMUSCULAR | Status: AC
  Filled 2012-08-14: qty 1

## 2012-08-14 MED ORDER — DIPHENHYDRAMINE HCL 50 MG/ML IJ SOLN: 12.5000 mg | INTRAMUSCULAR | Status: DC | PRN

## 2012-08-14 MED ORDER — MEPERIDINE HCL 25 MG/ML IJ SOLN: 6.2500 mg | INTRAMUSCULAR | Status: DC | PRN

## 2012-08-14 MED ORDER — MIDAZOLAM HCL 2 MG/2ML IJ SOLN: 0.5000 mg | Freq: Once | INTRAMUSCULAR | Status: DC | PRN

## 2012-08-14 MED ORDER — BUTORPHANOL TARTRATE 1 MG/ML IJ SOLN
1.0000 mg | Freq: Once | INTRAMUSCULAR | Status: DC
  Filled 2012-08-14: qty 1

## 2012-08-14 MED ORDER — TETANUS-DIPHTH-ACELL PERTUSSIS 5-2.5-18.5 LF-MCG/0.5 IM SUSP: 0.5000 mL | Freq: Once | INTRAMUSCULAR | Status: DC

## 2012-08-14 MED ORDER — OXYTOCIN 40 UNITS IN LACTATED RINGERS INFUSION - SIMPLE MED: 62.5000 mL/h | INTRAVENOUS | Status: AC

## 2012-08-14 MED ORDER — ONDANSETRON HCL 4 MG/2ML IJ SOLN
INTRAMUSCULAR | Status: AC
  Filled 2012-08-14: qty 2

## 2012-08-14 MED ORDER — OXYTOCIN 10 UNIT/ML IJ SOLN
INTRAMUSCULAR | Status: AC
  Filled 2012-08-14: qty 4

## 2012-08-14 MED ORDER — SCOPOLAMINE 1 MG/3DAYS TD PT72
1.0000 | MEDICATED_PATCH | Freq: Once | TRANSDERMAL | Status: AC
  Administered 2012-08-14: 1.5 mg via TRANSDERMAL

## 2012-08-14 MED ORDER — MENTHOL 3 MG MT LOZG: 1.0000 | LOZENGE | OROMUCOSAL | Status: DC | PRN

## 2012-08-14 MED ORDER — CEFAZOLIN SODIUM-DEXTROSE 2-3 GM-% IV SOLR
2.0000 g | Freq: Once | INTRAVENOUS | Status: DC
  Filled 2012-08-14: qty 50

## 2012-08-14 MED ORDER — IBUPROFEN 600 MG PO TABS
600.0000 mg | ORAL_TABLET | Freq: Four times a day (QID) | ORAL | Status: DC
  Administered 2012-08-14 – 2012-08-17 (×11): 600 mg via ORAL
  Filled 2012-08-14 (×11): qty 1

## 2012-08-14 MED ORDER — OXYCODONE-ACETAMINOPHEN 5-325 MG PO TABS
1.0000 | ORAL_TABLET | ORAL | Status: DC | PRN
  Administered 2012-08-15 – 2012-08-16 (×4): 1 via ORAL
  Filled 2012-08-14 (×4): qty 1

## 2012-08-14 MED ORDER — BUPIVACAINE IN DEXTROSE 0.75-8.25 % IT SOLN
INTRATHECAL | Status: DC | PRN
  Administered 2012-08-14: 1.6 mL via INTRATHECAL

## 2012-08-14 MED ORDER — LANOLIN HYDROUS EX OINT: 1.0000 "application " | TOPICAL_OINTMENT | CUTANEOUS | Status: DC | PRN

## 2012-08-14 MED ORDER — MORPHINE SULFATE (PF) 0.5 MG/ML IJ SOLN
INTRAMUSCULAR | Status: DC | PRN
  Administered 2012-08-14: .1 mg via INTRATHECAL

## 2012-08-14 MED ORDER — KETOROLAC TROMETHAMINE 30 MG/ML IJ SOLN: 30.0000 mg | Freq: Four times a day (QID) | INTRAMUSCULAR | Status: AC | PRN

## 2012-08-14 MED ORDER — ZOLPIDEM TARTRATE 5 MG PO TABS: 5.0000 mg | ORAL_TABLET | Freq: Every evening | ORAL | Status: DC | PRN

## 2012-08-14 MED ORDER — CITRIC ACID-SODIUM CITRATE 334-500 MG/5ML PO SOLN
30.0000 mL | Freq: Once | ORAL | Status: AC
  Administered 2012-08-14: 30 mL via ORAL
  Filled 2012-08-14: qty 15

## 2012-08-14 MED ORDER — DIBUCAINE 1 % RE OINT: 1.0000 "application " | TOPICAL_OINTMENT | RECTAL | Status: DC | PRN

## 2012-08-14 MED ORDER — DIPHENHYDRAMINE HCL 50 MG/ML IJ SOLN: 25.0000 mg | INTRAMUSCULAR | Status: DC | PRN

## 2012-08-14 MED ORDER — INFLUENZA VIRUS VACC SPLIT PF IM SUSP
0.5000 mL | INTRAMUSCULAR | Status: AC
  Administered 2012-08-15: 0.5 mL via INTRAMUSCULAR
  Filled 2012-08-14: qty 0.5

## 2012-08-14 MED ORDER — PHENYLEPHRINE HCL 10 MG/ML IJ SOLN
INTRAMUSCULAR | Status: DC | PRN
  Administered 2012-08-14 (×3): 40 ug via INTRAVENOUS
  Administered 2012-08-14 (×2): 80 ug via INTRAVENOUS
  Administered 2012-08-14: 40 ug via INTRAVENOUS

## 2012-08-14 MED ORDER — NALOXONE HCL 0.4 MG/ML IJ SOLN: 0.4000 mg | INTRAMUSCULAR | Status: DC | PRN

## 2012-08-14 MED ORDER — DIPHENHYDRAMINE HCL 25 MG PO CAPS
25.0000 mg | ORAL_CAPSULE | ORAL | Status: DC | PRN
  Filled 2012-08-14 (×2): qty 1

## 2012-08-14 MED ORDER — SENNOSIDES-DOCUSATE SODIUM 8.6-50 MG PO TABS
2.0000 | ORAL_TABLET | Freq: Every day | ORAL | Status: DC
  Administered 2012-08-14 – 2012-08-16 (×3): 2 via ORAL

## 2012-08-14 MED ORDER — ONDANSETRON HCL 4 MG/2ML IJ SOLN: 4.0000 mg | INTRAMUSCULAR | Status: DC | PRN

## 2012-08-14 MED ORDER — SCOPOLAMINE 1 MG/3DAYS TD PT72
MEDICATED_PATCH | TRANSDERMAL | Status: AC
  Filled 2012-08-14: qty 1

## 2012-08-14 MED ORDER — LACTATED RINGERS IV SOLN
INTRAVENOUS | Status: DC
  Administered 2012-08-14: 07:00:00 via INTRAVENOUS

## 2012-08-14 MED ORDER — PRENATAL MULTIVITAMIN CH
1.0000 | ORAL_TABLET | Freq: Every day | ORAL | Status: DC
  Administered 2012-08-15 – 2012-08-16 (×2): 1 via ORAL
  Filled 2012-08-14 (×2): qty 1

## 2012-08-14 MED ORDER — ONDANSETRON HCL 4 MG/2ML IJ SOLN: 4.0000 mg | Freq: Three times a day (TID) | INTRAMUSCULAR | Status: DC | PRN

## 2012-08-14 MED ORDER — NALBUPHINE HCL 10 MG/ML IJ SOLN
5.0000 mg | INTRAMUSCULAR | Status: DC | PRN
  Administered 2012-08-15: 10 mg via SUBCUTANEOUS
  Filled 2012-08-14: qty 1

## 2012-08-14 MED ORDER — ONDANSETRON HCL 4 MG/2ML IJ SOLN
INTRAMUSCULAR | Status: DC | PRN
  Administered 2012-08-14: 4 mg via INTRAVENOUS

## 2012-08-14 MED ORDER — PROMETHAZINE HCL 25 MG/ML IJ SOLN: 6.2500 mg | INTRAMUSCULAR | Status: DC | PRN

## 2012-08-14 MED ORDER — ONDANSETRON HCL 4 MG PO TABS: 4.0000 mg | ORAL_TABLET | ORAL | Status: DC | PRN

## 2012-08-14 MED ORDER — SODIUM CHLORIDE 0.9 % IV SOLN: 1.0000 ug/kg/h | INTRAVENOUS | Status: DC | PRN

## 2012-08-14 MED ORDER — CEFAZOLIN SODIUM-DEXTROSE 2-3 GM-% IV SOLR
INTRAVENOUS | Status: DC | PRN
  Administered 2012-08-14: 2 g via INTRAVENOUS

## 2012-08-14 MED ORDER — DIPHENHYDRAMINE HCL 25 MG PO CAPS: 25.0000 mg | ORAL_CAPSULE | Freq: Four times a day (QID) | ORAL | Status: DC | PRN

## 2012-08-14 MED ORDER — SIMETHICONE 80 MG PO CHEW: 80.0000 mg | CHEWABLE_TABLET | ORAL | Status: DC | PRN

## 2012-08-14 MED ORDER — FENTANYL CITRATE 0.05 MG/ML IJ SOLN
INTRAMUSCULAR | Status: AC
  Filled 2012-08-14: qty 2

## 2012-08-14 MED ORDER — ACETAMINOPHEN 10 MG/ML IV SOLN
1000.0000 mg | Freq: Four times a day (QID) | INTRAVENOUS | Status: AC | PRN
  Administered 2012-08-14: 1000 mg via INTRAVENOUS
  Filled 2012-08-14: qty 100

## 2012-08-14 MED ORDER — OXYTOCIN 10 UNIT/ML IJ SOLN
40.0000 [IU] | INTRAVENOUS | Status: DC | PRN
  Administered 2012-08-14: 40 [IU] via INTRAVENOUS

## 2012-08-14 MED ORDER — KETOROLAC TROMETHAMINE 30 MG/ML IJ SOLN
30.0000 mg | Freq: Four times a day (QID) | INTRAMUSCULAR | Status: AC | PRN
  Administered 2012-08-14: 30 mg via INTRAVENOUS

## 2012-08-14 MED ORDER — ATROPINE SULFATE 0.4 MG/ML IJ SOLN
INTRAMUSCULAR | Status: DC | PRN
  Administered 2012-08-14: 0.4 mg via INTRAVENOUS

## 2012-08-14 MED ORDER — SODIUM CHLORIDE 0.9 % IJ SOLN: 3.0000 mL | INTRAMUSCULAR | Status: DC | PRN

## 2012-08-14 MED ORDER — PHENYLEPHRINE 40 MCG/ML (10ML) SYRINGE FOR IV PUSH (FOR BLOOD PRESSURE SUPPORT)
PREFILLED_SYRINGE | INTRAVENOUS | Status: AC
  Filled 2012-08-14: qty 5

## 2012-08-14 MED ORDER — MORPHINE SULFATE 0.5 MG/ML IJ SOLN
INTRAMUSCULAR | Status: AC
  Filled 2012-08-14: qty 10

## 2012-08-14 MED ORDER — METOCLOPRAMIDE HCL 5 MG/ML IJ SOLN: 10.0000 mg | Freq: Three times a day (TID) | INTRAMUSCULAR | Status: DC | PRN

## 2012-08-14 MED ORDER — EPHEDRINE 5 MG/ML INJ
INTRAVENOUS | Status: AC
  Filled 2012-08-14: qty 10

## 2012-08-14 MED ORDER — OXYTOCIN 10 UNIT/ML IJ SOLN: 40.0000 [IU] | INTRAMUSCULAR | Status: DC | PRN

## 2012-08-14 MED ORDER — FAMOTIDINE IN NACL 20-0.9 MG/50ML-% IV SOLN
20.0000 mg | Freq: Once | INTRAVENOUS | Status: AC
  Administered 2012-08-14: 20 mg via INTRAVENOUS
  Filled 2012-08-14: qty 50

## 2012-08-14 MED ORDER — FENTANYL CITRATE 0.05 MG/ML IJ SOLN
INTRAMUSCULAR | Status: DC | PRN
  Administered 2012-08-14: 25 ug via INTRATHECAL

## 2012-08-14 SURGICAL SUPPLY — 32 items
ADH SKN CLS APL DERMABOND .7 (GAUZE/BANDAGES/DRESSINGS) ×1
CLOTH BEACON ORANGE TIMEOUT ST (SAFETY) ×2 IMPLANT
DERMABOND ADVANCED (GAUZE/BANDAGES/DRESSINGS) ×1
DERMABOND ADVANCED .7 DNX12 (GAUZE/BANDAGES/DRESSINGS) ×1 IMPLANT
DRAPE SURG 17X23 STRL (DRAPES) ×1 IMPLANT
DRSG COVADERM 4X10 (GAUZE/BANDAGES/DRESSINGS) ×1 IMPLANT
DURAPREP 26ML APPLICATOR (WOUND CARE) ×2 IMPLANT
ELECT REM PT RETURN 9FT ADLT (ELECTROSURGICAL) ×2
ELECTRODE REM PT RTRN 9FT ADLT (ELECTROSURGICAL) ×1 IMPLANT
EXTRACTOR VACUUM M CUP 4 TUBE (SUCTIONS) IMPLANT
GLOVE BIO SURGEON STRL SZ8.5 (GLOVE) ×4 IMPLANT
GOWN PREVENTION PLUS LG XLONG (DISPOSABLE) ×4 IMPLANT
GOWN PREVENTION PLUS XXLARGE (GOWN DISPOSABLE) ×2 IMPLANT
KIT ABG SYR 3ML LUER SLIP (SYRINGE) IMPLANT
NDL HYPO 25X5/8 SAFETYGLIDE (NEEDLE) ×1 IMPLANT
NEEDLE HYPO 25X5/8 SAFETYGLIDE (NEEDLE) ×2 IMPLANT
NS IRRIG 1000ML POUR BTL (IV SOLUTION) ×2 IMPLANT
PACK C SECTION WH (CUSTOM PROCEDURE TRAY) ×2 IMPLANT
PAD OB MATERNITY 4.3X12.25 (PERSONAL CARE ITEMS) IMPLANT
SLEEVE SCD COMPRESS KNEE MED (MISCELLANEOUS) ×1 IMPLANT
SUT CHROMIC 0 CT 802H (SUTURE) ×2 IMPLANT
SUT CHROMIC 1 CTX 36 (SUTURE) ×4 IMPLANT
SUT CHROMIC 2 0 SH (SUTURE) ×2 IMPLANT
SUT GUT PLAIN 0 CT-3 TAN 27 (SUTURE) ×1 IMPLANT
SUT MON AB 4-0 PS1 27 (SUTURE) ×2 IMPLANT
SUT VIC AB 0 CT1 18XCR BRD8 (SUTURE) IMPLANT
SUT VIC AB 0 CT1 8-18 (SUTURE)
SUT VIC AB 0 CTX 36 (SUTURE) ×4
SUT VIC AB 0 CTX36XBRD ANBCTRL (SUTURE) ×2 IMPLANT
TOWEL OR 17X24 6PK STRL BLUE (TOWEL DISPOSABLE) ×4 IMPLANT
TRAY FOLEY CATH 14FR (SET/KITS/TRAYS/PACK) ×2 IMPLANT
WATER STERILE IRR 1000ML POUR (IV SOLUTION) ×1 IMPLANT

## 2012-08-14 NOTE — Transfer of Care (Signed)
Immediate Anesthesia Transfer of Care Note  Patient: Lori Jackson  Procedure(s) Performed: Procedure(s) (LRB) with comments: CESAREAN SECTION (N/A)  Patient Location: PACU  Anesthesia Type:Spinal  Level of Consciousness: awake, alert  and oriented  Airway & Oxygen Therapy: Patient Spontanous Breathing  Post-op Assessment: Report given to PACU RN and Post -op Vital signs reviewed and stable  Post vital signs: Reviewed and stable  Complications: No apparent anesthesia complications

## 2012-08-14 NOTE — H&P (Signed)
Lori Jackson is a 21 y.o. female presenting for rom labor. History OB History    Grav Para Term Preterm Abortions TAB SAB Ect Mult Living   3 2 1 1      2      Past Medical History  Diagnosis Date  . No pertinent past medical history    Past Surgical History  Procedure Date  . Cesarean section   . Wisdom tooth extraction    Family History: family history includes Diabetes in her paternal grandmother and Hypertension in her paternal grandmother. Social History:  reports that she has never smoked. She does not have any smokeless tobacco history on file. She reports that she does not drink alcohol or use illicit drugs.   Prenatal Transfer Tool  Maternal Diabetes: No Genetic Screening: Normal Maternal Ultrasounds/Referrals: Normal Fetal Ultrasounds or other Referrals:  None Maternal Substance Abuse:  No Significant Maternal Medications:  Meds include: Other:  Significant Maternal Lab Results:  Lab values include: Group B Strep negative Other Comments:  pos  chlamydia treated  ROS  Dilation: Closed Station: -1;-2 Exam by:: Rudi Coco RN Blood pressure 101/58, pulse 102, temperature 97.7 F (36.5 C), temperature source Oral, resp. rate 18, height 5\' 5"  (1.651 m), weight 72.576 kg (160 lb), last menstrual period 11/24/2011. Exam Physical Exam  Prenatal labs: ABO, Rh:   Antibody:   Rubella:   RPR:    HBsAg:    HIV:    GBS:     Assessment/Plan: Repeat cs tl   MARSHALL,BERNARD A 08/14/2012, 7:34 AM

## 2012-08-14 NOTE — Anesthesia Postprocedure Evaluation (Signed)
Anesthesia Post Note  Patient: Lori Jackson  Procedure(s) Performed: Procedure(s) (LRB): CESAREAN SECTION (N/A)  Anesthesia type: Spinal  Patient location: PACU  Post pain: Pain level controlled  Post assessment: Post-op Vital signs reviewed  Last Vitals:  Filed Vitals:   08/14/12 1025  BP: 109/67  Pulse: 77  Temp: 36.4 C  Resp: 20    Post vital signs: Reviewed  Level of consciousness: awake  Complications: No apparent anesthesia complications

## 2012-08-14 NOTE — H&P (Signed)
This is Dr. Francoise Ceo dictating the history and physical on  Lori Jackson she's a 21 year old gravida 3 para 10/10/2000 who is a 37 weeks and 5 days her EDC is 08/30/2012 she's had 2 previous C-sections and she is now at 37 weeks and ruptured membranes and in labor and desires repeat C-section and tubal ligation Past medical history negative Past surgical history 2 C-sections in the past and multiparity Social history negative System review noncontributory Physical exam revealed a well-developed female in labor HEENT negative Is negative Heart regular rhythm no murmurs no gallops Abdomen term Pelvic cervix closed amniotic fluid clear Extremities negative

## 2012-08-14 NOTE — MAU Note (Signed)
Dr. Tamela Oddi notified of pt, orders rec'd.

## 2012-08-14 NOTE — Anesthesia Procedure Notes (Signed)
Spinal  Patient location during procedure: OR Start time: 08/14/2012 7:56 AM Staffing Anesthesiologist: Brayton Caves R Performed by: anesthesiologist  Preanesthetic Checklist Completed: patient identified, site marked, surgical consent, pre-op evaluation, timeout performed, IV checked, risks and benefits discussed and monitors and equipment checked Spinal Block Patient position: sitting Prep: DuraPrep Patient monitoring: heart rate, cardiac monitor, continuous pulse ox and blood pressure Approach: midline Location: L3-4 Injection technique: single-shot Needle Needle type: Sprotte  Needle gauge: 24 G Needle length: 9 cm Assessment Sensory level: T4 Additional Notes Patient identified.  Risk benefits discussed including failed block, incomplete pain control, headache, nerve damage, paralysis, blood pressure changes, nausea, vomiting, reactions to medication both toxic or allergic, and postpartum back pain.  Patient expressed understanding and wished to proceed.  All questions were answered.  Sterile technique used throughout procedure.  CSF was clear.  No parasthesia or other complications.  Please see nursing notes for vital signs.

## 2012-08-14 NOTE — Addendum Note (Signed)
Addendum  created 08/14/12 1815 by Elbert Ewings, CRNA   Modules edited:Notes Section

## 2012-08-14 NOTE — Anesthesia Preprocedure Evaluation (Deleted)
Anesthesia Evaluation  Patient identified by MRN, date of birth, ID band Patient awake    Reviewed: Allergy & Precautions, H&P , Patient's Chart, lab work & pertinent test results  Airway Mallampati: II TM Distance: >3 FB Neck ROM: full    Dental No notable dental hx.    Pulmonary neg pulmonary ROS,  breath sounds clear to auscultation  Pulmonary exam normal       Cardiovascular negative cardio ROS  Rhythm:regular Rate:Normal     Neuro/Psych negative neurological ROS  negative psych ROS   GI/Hepatic negative GI ROS, Neg liver ROS,   Endo/Other  negative endocrine ROS  Renal/GU negative Renal ROS     Musculoskeletal   Abdominal   Peds  Hematology negative hematology ROS (+)   Anesthesia Other Findings   Reproductive/Obstetrics (+) Pregnancy                           Anesthesia Physical Anesthesia Plan  ASA: II  Anesthesia Plan: Spinal   Post-op Pain Management:    Induction:   Airway Management Planned:   Additional Equipment:   Intra-op Plan:   Post-operative Plan:   Informed Consent: I have reviewed the patients History and Physical, chart, labs and discussed the procedure including the risks, benefits and alternatives for the proposed anesthesia with the patient or authorized representative who has indicated his/her understanding and acceptance.     Plan Discussed with:   Anesthesia Plan Comments:         Anesthesia Quick Evaluation  

## 2012-08-14 NOTE — Op Note (Signed)
preop diagnosis previous cesarean section at term with ruptured membranes in labor and multiparity Postop diagnosis repeat low transverse cesarean section and tubal ligation Surgeon Dr. Francoise Ceo First assistant Dr. Coral Ceo Anesthesia spinal Procedure patient placed on the operating table in the supine position after the spinal administered abdomen prepped and draped transverse incision made through the old scar carried him to the rectus fascia fascia cleaned and incised the length of the incision the recti muscles retracted laterally peritoneum incised longitudinally transverse incision made in the visceroperitoneum above the bladder bladder mobilized inferiorly transverse low uterine incision made the fluids clear patient delivered from a female Apgar 8 and 9 the team was in attendance the placenta was removed manually and the uterine cavity clean with dry laps uterine incision closed in one layer with continuous looped abnormal one chromic bladder flap reattached to a chromic hemostasis satisfactory the right tube was grasped in the midportion with a Babcock clamp 0 plain suture placed in the mesial salpinx below the portion of tube within the clamp this was tied and approximately 1 inch of tube transected procedure done in a similar fashion on the other side lap and sponge counts correct abdomen closed in layers peritoneum continuous with of 0 chromic fascia continuous with of 0 Dexon and the skin closed with 4-0 Monocryl subcuticular blood loss was 600 cc patient tolerated the procedure well

## 2012-08-14 NOTE — MAU Note (Signed)
Pt G3 P2 at 37.5wks, leaking clear fluid since 0530 and having contractions.  Prev C/S x 2.

## 2012-08-14 NOTE — Anesthesia Preprocedure Evaluation (Addendum)
Anesthesia Evaluation  Patient identified by MRN, date of birth, ID band Patient awake    Reviewed: Allergy & Precautions, H&P , Patient's Chart, lab work & pertinent test results  Airway Mallampati: II TM Distance: >3 FB Neck ROM: full    Dental No notable dental hx.    Pulmonary neg pulmonary ROS,  breath sounds clear to auscultation  Pulmonary exam normal       Cardiovascular negative cardio ROS  Rhythm:regular Rate:Normal     Neuro/Psych negative neurological ROS  negative psych ROS   GI/Hepatic negative GI ROS, Neg liver ROS,   Endo/Other  negative endocrine ROS  Renal/GU negative Renal ROS     Musculoskeletal   Abdominal   Peds  Hematology negative hematology ROS (+)   Anesthesia Other Findings   Reproductive/Obstetrics (+) Pregnancy                           Anesthesia Physical  Anesthesia Plan  ASA: II and emergent  Anesthesia Plan: Spinal   Post-op Pain Management:    Induction:   Airway Management Planned:   Additional Equipment:   Intra-op Plan:   Post-operative Plan:   Informed Consent: I have reviewed the patients History and Physical, chart, labs and discussed the procedure including the risks, benefits and alternatives for the proposed anesthesia with the patient or authorized representative who has indicated his/her understanding and acceptance.     Plan Discussed with:   Anesthesia Plan Comments:        Anesthesia Quick Evaluation

## 2012-08-14 NOTE — Anesthesia Postprocedure Evaluation (Signed)
  Anesthesia Post-op Note  Patient: Lori Jackson  Procedure(s) Performed: Procedure(s) (LRB) with comments: CESAREAN SECTION (N/A)  Patient Location: PACU and Mother/Baby  Anesthesia Type:Spinal  Level of Consciousness: awake, alert  and oriented  Airway and Oxygen Therapy: Patient Spontanous Breathing  Post-op Pain: mild  Post-op Assessment: Patient's Cardiovascular Status Stable, Respiratory Function Stable, No signs of Nausea or vomiting, Adequate PO intake and Pain level controlled  Post-op Vital Signs: stable  Complications: No apparent anesthesia complications

## 2012-08-14 NOTE — MAU Note (Signed)
Dr. Gaynell Face called about pt.  Order for stadol and prep for OR rec'd.

## 2012-08-15 ENCOUNTER — Encounter (HOSPITAL_COMMUNITY): Payer: Self-pay | Admitting: Obstetrics

## 2012-08-15 LAB — CBC
HCT: 28.6 % — ABNORMAL LOW (ref 36.0–46.0)
Hemoglobin: 9.4 g/dL — ABNORMAL LOW (ref 12.0–15.0)
MCH: 27.4 pg (ref 26.0–34.0)
MCHC: 32.9 g/dL (ref 30.0–36.0)
MCV: 83.4 fL (ref 78.0–100.0)
Platelets: 141 10*3/uL — ABNORMAL LOW (ref 150–400)
RBC: 3.43 MIL/uL — ABNORMAL LOW (ref 3.87–5.11)
RDW: 14 % (ref 11.5–15.5)
WBC: 11.3 10*3/uL — ABNORMAL HIGH (ref 4.0–10.5)

## 2012-08-15 LAB — RPR: RPR Ser Ql: NONREACTIVE

## 2012-08-15 LAB — CCBB MATERNAL DONOR DRAW

## 2012-08-15 NOTE — Progress Notes (Signed)
Ur chart review completed.  

## 2012-08-15 NOTE — Progress Notes (Signed)
Patient ID: Lori Jackson, female   DOB: August 11, 1991, 21 y.o.   MRN: [id number] Postop day 1 Vital signs normal Fundus firm Lochia moderate No complaints

## 2012-08-16 ENCOUNTER — Other Ambulatory Visit (HOSPITAL_COMMUNITY): Payer: Self-pay

## 2012-08-16 NOTE — Progress Notes (Signed)
Patient ID: Lori Jackson, female   DOB: 01-Aug-1991, 21 y.o.   MRN: [id number] Postop day 2 Vital signs normal Fundus firm Incision clean and dry Legs negative no complaints

## 2012-08-17 NOTE — Discharge Instructions (Signed)
Discharge instructions   You can wash your hair  Shower  Eat what you want  Drink what you want  See me in 6 weeks  Your ankles are going to swell more in the next 2 weeks than when pregnant  No sex for 6 weeks   Ezekeil Bethel A, MD 08/17/2012

## 2012-08-17 NOTE — Discharge Summary (Signed)
Obstetric Discharge Summary Reason for Admission: cesarean section Prenatal Procedures: none Intrapartum Procedures: cesarean: low cervical, transverse Postpartum Procedures: none Complications-Operative and Postpartum: none Hemoglobin  Date Value Range Status  08/15/2012 9.4* 12.0 - 15.0 g/dL Final     HCT  Date Value Range Status  08/15/2012 28.6* 36.0 - 46.0 % Final    Physical Exam:  General: alert Lochia: appropriate Uterine Fundus: firm Incision: healing well DVT Evaluation: No evidence of DVT seen on physical exam.  Discharge Diagnoses: Term Pregnancy-delivered  Discharge Information: Date: 08/17/2012 Activity: pelvic rest Diet: routine Medications: Percocet Condition: stable Instructions: refer to practice specific booklet Discharge to: home Follow-up Information    Call in 6 weeks to follow up.   Contact information:   [address]         Newborn Data: Live born female  Birth Weight: 7 lb 2.8 oz (3255 g) APGAR: 8, 9  Home with mother.  Sharlon Pfohl A 08/17/2012, 6:57 AM

## 2012-08-23 ENCOUNTER — Inpatient Hospital Stay: Admit: 2012-08-23 | Payer: Self-pay | Admitting: Obstetrics

## 2012-08-23 SURGERY — Surgical Case
Anesthesia: Regional | Laterality: Bilateral

## 2012-09-29 ENCOUNTER — Inpatient Hospital Stay (HOSPITAL_COMMUNITY)
Admission: AD | Admit: 2012-09-29 | Discharge: 2012-09-29 | Disposition: A | Discharge status: Home / Self Care | Payer: Self-pay | Source: Ambulatory Visit | Attending: Obstetrics | Admitting: Obstetrics

## 2012-09-29 ENCOUNTER — Encounter (HOSPITAL_COMMUNITY): Payer: Self-pay | Admitting: *Deleted

## 2012-09-29 DIAGNOSIS — O909 Complication of the puerperium, unspecified: Secondary | ICD-10-CM | POA: Insufficient documentation

## 2012-09-29 DIAGNOSIS — O900 Disruption of cesarean delivery wound: Secondary | ICD-10-CM

## 2012-09-29 NOTE — MAU Note (Signed)
Pt reports she had a c-section on 08/14/12. Stated there had been a little bump in the middle of incision. Showed Dr. Gaynell Face on Thursday and he said it would heel. Took shower last night and noticed it was open . No drainage no pain

## 2012-09-29 NOTE — MAU Provider Note (Signed)
  History     CSN: [id number]  Arrival date and time: 09/29/12 1410   None     Chief Complaint  Patient presents with  . Wound Check   HPI 21 y.o. [id number] at 7 week PP s/p c-section. States there has been a small "bump" in the middle of her c/s incision for a while, today it appears to have "opened up" and now there is pink tissue there. No pain, exudate, bleeding.    Past Medical History  Diagnosis Date  . No pertinent past medical history     Past Surgical History  Procedure Date  . Cesarean section   . Wisdom tooth extraction   . Cesarean section 08/14/2012    Procedure: CESAREAN SECTION;  Surgeon: Kathreen Cosier, MD;  Location: [facility];  Service: Obstetrics;  Laterality: N/A;    Family History  Problem Relation Age of Onset  . Diabetes Paternal Grandmother   . Hypertension Paternal Grandmother     History  Substance Use Topics  . Smoking status: Never Smoker   . Smokeless tobacco: Not on file  . Alcohol Use: No    Allergies:  Allergies  Allergen Reactions  . Codeine Rash    Pt can take Vicodin    No prescriptions prior to admission    Review of Systems  Constitutional: Negative.   Respiratory: Negative.   Cardiovascular: Negative.   Gastrointestinal: Negative for nausea, vomiting, abdominal pain, diarrhea and constipation.  Genitourinary: Negative for dysuria, urgency, frequency, hematuria and flank pain.       Negative for vaginal bleeding, vaginal discharge, dyspareunia  Musculoskeletal: Negative.   Neurological: Negative.   Psychiatric/Behavioral: Negative.    Physical Exam   Blood pressure 108/65, pulse 77, temperature 97.8 F (36.6 C), temperature source Oral, resp. rate 18, height 5\' 5"  (1.651 m), weight 138 lb 3.2 oz (62.687 kg), last menstrual period 11/24/2011, not currently breastfeeding.  Physical Exam  Nursing note and vitals reviewed. Constitutional: She is oriented to person, place, and time. She appears well-developed and  well-nourished. No distress.  Cardiovascular: Normal rate.   Respiratory: Effort normal.  GI: Soft. She exhibits no distension and no mass. There is no tenderness. There is no rebound and no guarding.    Musculoskeletal: Normal range of motion.  Neurological: She is alert and oriented to person, place, and time.  Skin: Skin is warm and dry.  Psychiatric: She has a normal mood and affect.    MAU Course  Procedures   Assessment and Plan   1. Cesarean wound disruption   Monitor area, if not healing or if signs of infection/bleeding appear, call office    Medication List    Notice       You have not been prescribed any medications.             Follow-up Information    Follow up with MARSHALL,BERNARD A, MD. (If symptoms worsen)    Contact information:   [address] [place] [address] [phone]            Latosha Gaylord 09/29/2012, 3:15 PM

## 2012-09-29 NOTE — Discharge Instructions (Signed)
Call Dr. Gaynell Face or return to MAU if incision becomes red, hot, painful, swollen or if you develop bleeding or discharge from your incision.

## 2013-10-09 ENCOUNTER — Emergency Department (INDEPENDENT_AMBULATORY_CARE_PROVIDER_SITE_OTHER)
Admission: EM | Admit: 2013-10-09 | Discharge: 2013-10-09 | Disposition: A | Discharge status: Home / Self Care | Payer: Self-pay | Source: Home / Self Care | Attending: Family Medicine | Admitting: Family Medicine

## 2013-10-09 ENCOUNTER — Encounter (HOSPITAL_COMMUNITY): Payer: Self-pay | Admitting: Emergency Medicine

## 2013-10-09 DIAGNOSIS — K089 Disorder of teeth and supporting structures, unspecified: Secondary | ICD-10-CM

## 2013-10-09 DIAGNOSIS — K0889 Other specified disorders of teeth and supporting structures: Secondary | ICD-10-CM

## 2013-10-09 MED ORDER — AMOXICILLIN 500 MG PO CAPS: 500.0000 mg | ORAL_CAPSULE | Freq: Three times a day (TID) | ORAL | Status: DC

## 2013-10-09 MED ORDER — HYDROCODONE-ACETAMINOPHEN 5-325 MG PO TABS: 0.5000 | ORAL_TABLET | Freq: Every evening | ORAL | Status: DC | PRN

## 2013-10-09 NOTE — ED Notes (Signed)
C/o toothache since Monday States pain is on left bottom tooth  Made appt with dentist on next Thursday Thinks she has infection in tooth Ibuprofen, BC powder was taking for pain relief.

## 2013-10-09 NOTE — Discharge Instructions (Signed)
Thank you for coming in today. Use ibuprofen or Aleve for regular pain. Use Norco for severe pain. Take amoxicillin 3 times daily. Followup with your dentist as soon as possible.  Dental Pain A tooth ache may be caused by cavities (tooth decay). Cavities expose the nerve of the tooth to air and hot or cold temperatures. It may come from an infection or abscess (also called a boil or furuncle) around your tooth. It is also often caused by dental caries (tooth decay). This causes the pain you are having. DIAGNOSIS  Your caregiver can diagnose this problem by exam. TREATMENT   If caused by an infection, it may be treated with medications which kill germs (antibiotics) and pain medications as prescribed by your caregiver. Take medications as directed.  Only take over-the-counter or prescription medicines for pain, discomfort, or fever as directed by your caregiver.  Whether the tooth ache today is caused by infection or dental disease, you should see your dentist as soon as possible for further care. SEEK MEDICAL CARE IF: The exam and treatment you received today has been provided on an emergency basis only. This is not a substitute for complete medical or dental care. If your problem worsens or new problems (symptoms) appear, and you are unable to meet with your dentist, call or return to this location. SEEK IMMEDIATE MEDICAL CARE IF:   You have a fever.  You develop redness and swelling of your face, jaw, or neck.  You are unable to open your mouth.  You have severe pain uncontrolled by pain medicine. MAKE SURE YOU:   Understand these instructions.  Will watch your condition.  Will get help right away if you are not doing well or get worse. Document Released: 09/26/2005 Document Revised: 12/19/2011 Document Reviewed: 05/14/2008 [facility] Patient Information 2014 [place], [place].

## 2013-10-09 NOTE — ED Provider Notes (Signed)
Lori Jackson is a 22 y.o. female who presents to Urgent Care today for left lower posterior tooth pain present for the last one week. Patient has tried Tylenol and ibuprofen which have not helped. She has an appointment with a dentist in about one week. She is not currently lactating. Pain is moderate to severe and worse with chewing. No fevers chills nausea vomiting or diarrhea.   Past Medical History  Diagnosis Date  . No pertinent past medical history    History  Substance Use Topics  . Smoking status: Never Smoker   . Smokeless tobacco: Not on file  . Alcohol Use: No   ROS as above Medications reviewed. No current facility-administered medications for this encounter.   Current Outpatient Prescriptions  Medication Sig Dispense Refill  . amoxicillin (AMOXIL) 500 MG capsule Take 1 capsule (500 mg total) by mouth 3 (three) times daily.  30 capsule  0  . HYDROcodone-acetaminophen (NORCO/VICODIN) 5-325 MG per tablet Take 0.5 tablets by mouth at bedtime as needed (cough).  6 tablet  0    Exam:  BP 111/61  Pulse 80  Temp(Src) 98.3 F (36.8 C) (Oral)  Resp 14  SpO2 100%  LMP 10/07/2013 Gen: Well NAD HEENT: EOMI,  MMM, erythematous left rear most lower molar. Tender to touch. No significant abdominal erythema or induration. No cheek swelling. No cervical lymphadenopathy present   Assessment and Plan: 22 y.o. female with dental pain. Plan to treat with amoxicillin, and Norco. Followup with dentist ASAP.  Discussed warning signs or symptoms. Please see discharge instructions. Patient expresses understanding.      Rodolph Bong, MD 10/09/13 1120

## 2014-08-11 ENCOUNTER — Encounter (HOSPITAL_COMMUNITY): Payer: Self-pay | Admitting: Emergency Medicine

## 2017-02-17 ENCOUNTER — Other Ambulatory Visit (HOSPITAL_COMMUNITY)
Admission: RE | Admit: 2017-02-17 | Discharge: 2017-02-17 | Disposition: A | Discharge status: Home / Self Care | Payer: BLUE CROSS/BLUE SHIELD | Source: Ambulatory Visit | Attending: Obstetrics and Gynecology | Admitting: Obstetrics and Gynecology

## 2017-02-17 ENCOUNTER — Other Ambulatory Visit: Payer: Self-pay | Admitting: Obstetrics and Gynecology

## 2017-02-17 DIAGNOSIS — Z01419 Encounter for gynecological examination (general) (routine) without abnormal findings: Secondary | ICD-10-CM | POA: Diagnosis not present

## 2017-02-17 DIAGNOSIS — Z113 Encounter for screening for infections with a predominantly sexual mode of transmission: Secondary | ICD-10-CM | POA: Diagnosis present

## 2017-02-20 LAB — CYTOLOGY - PAP
Chlamydia: NEGATIVE
Diagnosis: NEGATIVE
Neisseria Gonorrhea: NEGATIVE

## 2018-03-02 ENCOUNTER — Other Ambulatory Visit: Payer: Self-pay | Admitting: Obstetrics and Gynecology

## 2018-03-02 ENCOUNTER — Other Ambulatory Visit (HOSPITAL_COMMUNITY)
Admission: RE | Admit: 2018-03-02 | Discharge: 2018-03-02 | Disposition: A | Discharge status: Home / Self Care | Payer: BLUE CROSS/BLUE SHIELD | Source: Ambulatory Visit | Attending: Obstetrics and Gynecology | Admitting: Obstetrics and Gynecology

## 2018-03-02 DIAGNOSIS — N6001 Solitary cyst of right breast: Secondary | ICD-10-CM

## 2018-03-02 DIAGNOSIS — N644 Mastodynia: Secondary | ICD-10-CM

## 2018-03-02 DIAGNOSIS — Z124 Encounter for screening for malignant neoplasm of cervix: Secondary | ICD-10-CM | POA: Diagnosis not present

## 2018-03-06 ENCOUNTER — Inpatient Hospital Stay
Admission: RE | Admit: 2018-03-06 | Discharge: 2018-03-06 | Disposition: A | Discharge status: Home / Self Care | Payer: BLUE CROSS/BLUE SHIELD | Source: Ambulatory Visit | Attending: Obstetrics and Gynecology | Admitting: Obstetrics and Gynecology

## 2018-03-07 LAB — CYTOLOGY - PAP
Chlamydia: NEGATIVE
Diagnosis: NEGATIVE
Neisseria Gonorrhea: NEGATIVE

## 2018-03-15 ENCOUNTER — Ambulatory Visit
Admission: RE | Admit: 2018-03-15 | Discharge: 2018-03-15 | Disposition: A | Discharge status: Home / Self Care | Payer: BLUE CROSS/BLUE SHIELD | Source: Ambulatory Visit | Attending: Obstetrics and Gynecology | Admitting: Obstetrics and Gynecology

## 2018-03-15 DIAGNOSIS — N644 Mastodynia: Secondary | ICD-10-CM

## 2018-03-15 DIAGNOSIS — N6001 Solitary cyst of right breast: Secondary | ICD-10-CM

## 2019-02-25 ENCOUNTER — Emergency Department (HOSPITAL_COMMUNITY): Payer: Self-pay | Admitting: Certified Registered"

## 2019-02-25 ENCOUNTER — Encounter (HOSPITAL_COMMUNITY)
Admission: EM | Disposition: A | Discharge status: Home / Self Care | Payer: Self-pay | Source: Home / Self Care | Attending: Emergency Medicine

## 2019-02-25 ENCOUNTER — Encounter (HOSPITAL_COMMUNITY): Payer: Self-pay | Admitting: Emergency Medicine

## 2019-02-25 ENCOUNTER — Other Ambulatory Visit: Payer: Self-pay

## 2019-02-25 ENCOUNTER — Encounter (HOSPITAL_COMMUNITY): Payer: Self-pay | Admitting: *Deleted

## 2019-02-25 ENCOUNTER — Observation Stay (HOSPITAL_COMMUNITY)
Admission: EM | Admit: 2019-02-25 | Discharge: 2019-02-26 | Disposition: A | Discharge status: Home / Self Care | Payer: Self-pay | Attending: Physician Assistant | Admitting: Physician Assistant

## 2019-02-25 ENCOUNTER — Emergency Department (HOSPITAL_COMMUNITY): Payer: Self-pay

## 2019-02-25 ENCOUNTER — Ambulatory Visit (HOSPITAL_COMMUNITY)
Admission: EM | Admit: 2019-02-25 | Discharge: 2019-02-25 | Disposition: A | Discharge status: Home / Self Care | Payer: BLUE CROSS/BLUE SHIELD | Attending: Family Medicine | Admitting: Family Medicine

## 2019-02-25 DIAGNOSIS — K37 Unspecified appendicitis: Secondary | ICD-10-CM

## 2019-02-25 DIAGNOSIS — K3580 Unspecified acute appendicitis: Principal | ICD-10-CM | POA: Insufficient documentation

## 2019-02-25 DIAGNOSIS — R112 Nausea with vomiting, unspecified: Secondary | ICD-10-CM

## 2019-02-25 DIAGNOSIS — R1084 Generalized abdominal pain: Secondary | ICD-10-CM

## 2019-02-25 DIAGNOSIS — Z1159 Encounter for screening for other viral diseases: Secondary | ICD-10-CM | POA: Insufficient documentation

## 2019-02-25 DIAGNOSIS — R6883 Chills (without fever): Secondary | ICD-10-CM

## 2019-02-25 HISTORY — PX: LAPAROSCOPIC APPENDECTOMY: SHX408

## 2019-02-25 LAB — CBC WITH DIFFERENTIAL/PLATELET
Abs Immature Granulocytes: 0.06 10*3/uL (ref 0.00–0.07)
Basophils Absolute: 0 10*3/uL (ref 0.0–0.1)
Basophils Relative: 0 %
Eosinophils Absolute: 0 10*3/uL (ref 0.0–0.5)
Eosinophils Relative: 0 %
HCT: 35.4 % — ABNORMAL LOW (ref 36.0–46.0)
Hemoglobin: 11.2 g/dL — ABNORMAL LOW (ref 12.0–15.0)
Immature Granulocytes: 0 %
Lymphocytes Relative: 2 %
Lymphs Abs: 0.3 10*3/uL — ABNORMAL LOW (ref 0.7–4.0)
MCH: 27.4 pg (ref 26.0–34.0)
MCHC: 31.6 g/dL (ref 30.0–36.0)
MCV: 86.6 fL (ref 80.0–100.0)
Monocytes Absolute: 0.4 10*3/uL (ref 0.1–1.0)
Monocytes Relative: 3 %
Neutro Abs: 14.6 10*3/uL — ABNORMAL HIGH (ref 1.7–7.7)
Neutrophils Relative %: 95 %
Platelets: 227 10*3/uL (ref 150–400)
RBC: 4.09 MIL/uL (ref 3.87–5.11)
RDW: 13.2 % (ref 11.5–15.5)
WBC: 15.4 10*3/uL — ABNORMAL HIGH (ref 4.0–10.5)
nRBC: 0 % (ref 0.0–0.2)

## 2019-02-25 LAB — COMPREHENSIVE METABOLIC PANEL
ALT: 19 U/L (ref 0–44)
AST: 21 U/L (ref 15–41)
Albumin: 3.9 g/dL (ref 3.5–5.0)
Alkaline Phosphatase: 48 U/L (ref 38–126)
Anion gap: 9 (ref 5–15)
BUN: 11 mg/dL (ref 6–20)
CO2: 22 mmol/L (ref 22–32)
Calcium: 9.2 mg/dL (ref 8.9–10.3)
Chloride: 107 mmol/L (ref 98–111)
Creatinine, Ser: 0.66 mg/dL (ref 0.44–1.00)
GFR calc Af Amer: >60 mL/min (ref >60)
GFR calc non Af Amer: >60 mL/min (ref >60)
Glucose, Bld: 152 mg/dL — ABNORMAL HIGH (ref 70–99)
Potassium: 3.8 mmol/L (ref 3.5–5.1)
Sodium: 138 mmol/L (ref 135–145)
Total Bilirubin: 0.8 mg/dL (ref 0.3–1.2)
Total Protein: 6.9 g/dL (ref 6.5–8.1)

## 2019-02-25 LAB — POC URINE PREG, ED: Preg Test, Ur: NEGATIVE

## 2019-02-25 LAB — LIPASE, BLOOD: Lipase: 25 U/L (ref 11–51)

## 2019-02-25 LAB — SARS CORONAVIRUS 2 BY RT PCR (HOSPITAL ORDER, PERFORMED IN ~~LOC~~ HOSPITAL LAB): SARS Coronavirus 2: NEGATIVE

## 2019-02-25 SURGERY — APPENDECTOMY, LAPAROSCOPIC
Anesthesia: General | Site: Abdomen

## 2019-02-25 MED ORDER — DEXAMETHASONE SODIUM PHOSPHATE 10 MG/ML IJ SOLN
INTRAMUSCULAR | Status: DC | PRN
  Administered 2019-02-25: 4 mg via INTRAVENOUS

## 2019-02-25 MED ORDER — BUPIVACAINE-EPINEPHRINE (PF) 0.25% -1:200000 IJ SOLN
INTRAMUSCULAR | Status: DC | PRN
  Administered 2019-02-25: 3 mL

## 2019-02-25 MED ORDER — TRAMADOL HCL 50 MG PO TABS
50.0000 mg | ORAL_TABLET | Freq: Four times a day (QID) | ORAL | Status: DC | PRN
  Administered 2019-02-25 – 2019-02-26 (×2): 50 mg via ORAL
  Filled 2019-02-25 (×2): qty 1

## 2019-02-25 MED ORDER — PROPOFOL 10 MG/ML IV BOLUS
INTRAVENOUS | Status: DC | PRN
  Administered 2019-02-25: 30 mg via INTRAVENOUS
  Administered 2019-02-25: 130 mg via INTRAVENOUS

## 2019-02-25 MED ORDER — PHENYLEPHRINE 40 MCG/ML (10ML) SYRINGE FOR IV PUSH (FOR BLOOD PRESSURE SUPPORT)
PREFILLED_SYRINGE | INTRAVENOUS | Status: DC | PRN
  Administered 2019-02-25 (×2): 80 ug via INTRAVENOUS
  Administered 2019-02-25: 120 ug via INTRAVENOUS
  Administered 2019-02-25: 80 ug via INTRAVENOUS

## 2019-02-25 MED ORDER — ONDANSETRON HCL 4 MG/2ML IJ SOLN
INTRAMUSCULAR | Status: DC | PRN
  Administered 2019-02-25: 4 mg via INTRAVENOUS

## 2019-02-25 MED ORDER — SUCCINYLCHOLINE CHLORIDE 200 MG/10ML IV SOSY
PREFILLED_SYRINGE | INTRAVENOUS | Status: DC | PRN
  Administered 2019-02-25: 60 mg via INTRAVENOUS

## 2019-02-25 MED ORDER — ACETAMINOPHEN 10 MG/ML IV SOLN: 1000.0000 mg | Freq: Once | INTRAVENOUS | Status: DC | PRN

## 2019-02-25 MED ORDER — OXYCODONE HCL 5 MG/5ML PO SOLN: 5.0000 mg | Freq: Once | ORAL | Status: DC | PRN

## 2019-02-25 MED ORDER — FENTANYL CITRATE (PF) 100 MCG/2ML IJ SOLN
INTRAMUSCULAR | Status: AC
  Filled 2019-02-25: qty 2

## 2019-02-25 MED ORDER — ONDANSETRON 4 MG PO TBDP
ORAL_TABLET | ORAL | Status: AC
  Filled 2019-02-25: qty 1

## 2019-02-25 MED ORDER — BUPIVACAINE-EPINEPHRINE (PF) 0.25% -1:200000 IJ SOLN
INTRAMUSCULAR | Status: AC
  Filled 2019-02-25: qty 30

## 2019-02-25 MED ORDER — ONDANSETRON HCL 4 MG/2ML IJ SOLN
INTRAMUSCULAR | Status: AC
  Filled 2019-02-25: qty 2

## 2019-02-25 MED ORDER — ACETAMINOPHEN 500 MG PO TABS: 1000.0000 mg | ORAL_TABLET | Freq: Once | ORAL | Status: DC | PRN

## 2019-02-25 MED ORDER — IBUPROFEN 600 MG PO TABS: 600.0000 mg | ORAL_TABLET | Freq: Four times a day (QID) | ORAL | Status: DC | PRN

## 2019-02-25 MED ORDER — FENTANYL CITRATE (PF) 250 MCG/5ML IJ SOLN
INTRAMUSCULAR | Status: DC | PRN
  Administered 2019-02-25: 100 ug via INTRAVENOUS
  Administered 2019-02-25 (×3): 50 ug via INTRAVENOUS

## 2019-02-25 MED ORDER — DEXTROSE-NACL 5-0.9 % IV SOLN
INTRAVENOUS | Status: DC
  Administered 2019-02-25 – 2019-02-26 (×2): via INTRAVENOUS

## 2019-02-25 MED ORDER — OXYCODONE HCL 5 MG PO TABS: 5.0000 mg | ORAL_TABLET | Freq: Once | ORAL | Status: DC | PRN

## 2019-02-25 MED ORDER — FENTANYL CITRATE (PF) 100 MCG/2ML IJ SOLN
50.0000 ug | Freq: Once | INTRAMUSCULAR | Status: AC
  Administered 2019-02-25: 50 ug via INTRAVENOUS
  Filled 2019-02-25: qty 2

## 2019-02-25 MED ORDER — ONDANSETRON HCL 4 MG/2ML IJ SOLN: 4.0000 mg | Freq: Four times a day (QID) | INTRAMUSCULAR | Status: DC | PRN

## 2019-02-25 MED ORDER — METRONIDAZOLE IN NACL 5-0.79 MG/ML-% IV SOLN
500.0000 mg | Freq: Once | INTRAVENOUS | Status: AC
  Administered 2019-02-25: 500 mg via INTRAVENOUS
  Filled 2019-02-25: qty 100

## 2019-02-25 MED ORDER — ROCURONIUM BROMIDE 10 MG/ML (PF) SYRINGE
PREFILLED_SYRINGE | INTRAVENOUS | Status: DC | PRN
  Administered 2019-02-25: 10 mg via INTRAVENOUS
  Administered 2019-02-25: 30 mg via INTRAVENOUS

## 2019-02-25 MED ORDER — CELECOXIB 200 MG PO CAPS
200.0000 mg | ORAL_CAPSULE | Freq: Two times a day (BID) | ORAL | Status: DC
  Administered 2019-02-25 – 2019-02-26 (×2): 200 mg via ORAL
  Filled 2019-02-25 (×2): qty 1

## 2019-02-25 MED ORDER — HEMOSTATIC AGENTS (NO CHARGE) OPTIME
TOPICAL | Status: DC | PRN
  Administered 2019-02-25: 1

## 2019-02-25 MED ORDER — GABAPENTIN 300 MG PO CAPS
300.0000 mg | ORAL_CAPSULE | Freq: Two times a day (BID) | ORAL | Status: DC
  Administered 2019-02-25 – 2019-02-26 (×2): 300 mg via ORAL
  Filled 2019-02-25 (×2): qty 1

## 2019-02-25 MED ORDER — FENTANYL CITRATE (PF) 100 MCG/2ML IJ SOLN
25.0000 ug | INTRAMUSCULAR | Status: DC | PRN
  Administered 2019-02-25: 25 ug via INTRAVENOUS

## 2019-02-25 MED ORDER — 0.9 % SODIUM CHLORIDE (POUR BTL) OPTIME
TOPICAL | Status: DC | PRN
  Administered 2019-02-25: 1000 mL

## 2019-02-25 MED ORDER — SUGAMMADEX SODIUM 200 MG/2ML IV SOLN
INTRAVENOUS | Status: DC | PRN
  Administered 2019-02-25: 200 mg via INTRAVENOUS

## 2019-02-25 MED ORDER — DEXAMETHASONE SODIUM PHOSPHATE 10 MG/ML IJ SOLN
INTRAMUSCULAR | Status: AC
  Filled 2019-02-25: qty 1

## 2019-02-25 MED ORDER — ENOXAPARIN SODIUM 40 MG/0.4ML ~~LOC~~ SOLN
40.0000 mg | SUBCUTANEOUS | Status: DC
  Administered 2019-02-26: 40 mg via SUBCUTANEOUS
  Filled 2019-02-25: qty 0.4

## 2019-02-25 MED ORDER — LIDOCAINE 2% (20 MG/ML) 5 ML SYRINGE
INTRAMUSCULAR | Status: DC | PRN
  Administered 2019-02-25: 60 mg via INTRAVENOUS

## 2019-02-25 MED ORDER — MIDAZOLAM HCL 5 MG/5ML IJ SOLN
INTRAMUSCULAR | Status: DC | PRN
  Administered 2019-02-25: 2 mg via INTRAVENOUS

## 2019-02-25 MED ORDER — SODIUM CHLORIDE 0.9 % IV SOLN
2.0000 g | Freq: Once | INTRAVENOUS | Status: AC
  Administered 2019-02-25: 2 g via INTRAVENOUS
  Filled 2019-02-25: qty 20

## 2019-02-25 MED ORDER — ONDANSETRON 4 MG PO TBDP: 4.0000 mg | ORAL_TABLET | Freq: Four times a day (QID) | ORAL | Status: DC | PRN

## 2019-02-25 MED ORDER — SODIUM CHLORIDE 0.9 % IR SOLN
Status: DC | PRN
  Administered 2019-02-25: 1000 mL

## 2019-02-25 MED ORDER — FENTANYL CITRATE (PF) 250 MCG/5ML IJ SOLN
INTRAMUSCULAR | Status: AC
  Filled 2019-02-25: qty 5

## 2019-02-25 MED ORDER — IOHEXOL 300 MG/ML  SOLN
100.0000 mL | Freq: Once | INTRAMUSCULAR | Status: AC | PRN
  Administered 2019-02-25: 100 mL via INTRAVENOUS

## 2019-02-25 MED ORDER — FENTANYL CITRATE (PF) 100 MCG/2ML IJ SOLN: 50.0000 ug | INTRAMUSCULAR | Status: DC | PRN

## 2019-02-25 MED ORDER — LACTATED RINGERS IV SOLN
INTRAVENOUS | Status: DC | PRN
  Administered 2019-02-25 (×2): via INTRAVENOUS

## 2019-02-25 MED ORDER — MIDAZOLAM HCL 2 MG/2ML IJ SOLN
INTRAMUSCULAR | Status: AC
  Filled 2019-02-25: qty 2

## 2019-02-25 MED ORDER — SODIUM CHLORIDE 0.9 % IV BOLUS
1000.0000 mL | Freq: Once | INTRAVENOUS | Status: AC
  Administered 2019-02-25: 1000 mL via INTRAVENOUS

## 2019-02-25 MED ORDER — ROCURONIUM BROMIDE 10 MG/ML (PF) SYRINGE
PREFILLED_SYRINGE | INTRAVENOUS | Status: AC
  Filled 2019-02-25: qty 10

## 2019-02-25 MED ORDER — ACETAMINOPHEN 160 MG/5ML PO SOLN: 1000.0000 mg | Freq: Once | ORAL | Status: DC | PRN

## 2019-02-25 MED ORDER — ACETAMINOPHEN 500 MG PO TABS
1000.0000 mg | ORAL_TABLET | Freq: Four times a day (QID) | ORAL | Status: DC
  Administered 2019-02-25 – 2019-02-26 (×2): 1000 mg via ORAL
  Filled 2019-02-25 (×2): qty 2

## 2019-02-25 MED ORDER — ONDANSETRON 4 MG PO TBDP
4.0000 mg | ORAL_TABLET | Freq: Once | ORAL | Status: AC
  Administered 2019-02-25: 4 mg via ORAL

## 2019-02-25 SURGICAL SUPPLY — 47 items
ADH SKN CLS APL DERMABOND .7 (GAUZE/BANDAGES/DRESSINGS) ×1
APPLIER CLIP ROT 10 11.4 M/L (STAPLE)
APR CLP MED LRG 11.4X10 (STAPLE)
BAG SPEC RTRVL 10 TROC 200 (ENDOMECHANICALS) ×1
BLADE CLIPPER SURG (BLADE) IMPLANT
CANISTER SUCT 3000ML PPV (MISCELLANEOUS) ×2 IMPLANT
CHLORAPREP W/TINT 26ML (MISCELLANEOUS) ×2 IMPLANT
CLIP APPLIE ROT 10 11.4 M/L (STAPLE) IMPLANT
COVER SURGICAL LIGHT HANDLE (MISCELLANEOUS) ×2 IMPLANT
COVER WAND RF STERILE (DRAPES) ×2 IMPLANT
CUTTER FLEX LINEAR 45M (STAPLE) ×2 IMPLANT
DERMABOND ADVANCED (GAUZE/BANDAGES/DRESSINGS) ×1
DERMABOND ADVANCED .7 DNX12 (GAUZE/BANDAGES/DRESSINGS) ×1 IMPLANT
DRAPE WARM FLUID 44X44 (DRAPE) ×2 IMPLANT
ELECT REM PT RETURN 9FT ADLT (ELECTROSURGICAL) ×2
ELECTRODE REM PT RTRN 9FT ADLT (ELECTROSURGICAL) ×1 IMPLANT
ENDOLOOP SUT PDS II  0 18 (SUTURE)
ENDOLOOP SUT PDS II 0 18 (SUTURE) IMPLANT
GLOVE BIO SURGEON STRL SZ8 (GLOVE) ×2 IMPLANT
GLOVE BIOGEL PI IND STRL 8 (GLOVE) ×1 IMPLANT
GLOVE BIOGEL PI INDICATOR 8 (GLOVE) ×1
GOWN STRL REUS W/ TWL LRG LVL3 (GOWN DISPOSABLE) ×2 IMPLANT
GOWN STRL REUS W/ TWL XL LVL3 (GOWN DISPOSABLE) ×1 IMPLANT
GOWN STRL REUS W/TWL LRG LVL3 (GOWN DISPOSABLE) ×4
GOWN STRL REUS W/TWL XL LVL3 (GOWN DISPOSABLE) ×2
KIT BASIN OR (CUSTOM PROCEDURE TRAY) ×2 IMPLANT
KIT TURNOVER KIT B (KITS) ×2 IMPLANT
NS IRRIG 1000ML POUR BTL (IV SOLUTION) ×2 IMPLANT
PAD ARMBOARD 7.5X6 YLW CONV (MISCELLANEOUS) ×4 IMPLANT
POUCH RETRIEVAL ECOSAC 10 (ENDOMECHANICALS) ×1 IMPLANT
POUCH RETRIEVAL ECOSAC 10MM (ENDOMECHANICALS) ×1
RELOAD STAPLE 45 3.5 BLU ETS (ENDOMECHANICALS) ×1 IMPLANT
RELOAD STAPLE TA45 3.5 REG BLU (ENDOMECHANICALS) ×4 IMPLANT
SCISSORS LAP 5X35 DISP (ENDOMECHANICALS) ×2 IMPLANT
SET IRRIG TUBING LAPAROSCOPIC (IRRIGATION / IRRIGATOR) ×2 IMPLANT
SET TUBE SMOKE EVAC HIGH FLOW (TUBING) ×2 IMPLANT
SHEARS HARMONIC ACE PLUS 36CM (ENDOMECHANICALS) ×2 IMPLANT
SPECIMEN JAR SMALL (MISCELLANEOUS) ×2 IMPLANT
SURGICEL SNOW 2X4 (HEMOSTASIS) ×1 IMPLANT
SUT MON AB 4-0 PC3 18 (SUTURE) ×2 IMPLANT
TOWEL OR 17X24 6PK STRL BLUE (TOWEL DISPOSABLE) ×2 IMPLANT
TOWEL OR 17X26 10 PK STRL BLUE (TOWEL DISPOSABLE) ×2 IMPLANT
TRAY FOLEY CATH SILVER 16FR (SET/KITS/TRAYS/PACK) ×2 IMPLANT
TRAY LAPAROSCOPIC MC (CUSTOM PROCEDURE TRAY) ×2 IMPLANT
TROCAR XCEL BLADELESS 5X75MML (TROCAR) ×4 IMPLANT
TROCAR XCEL BLUNT TIP 100MML (ENDOMECHANICALS) ×2 IMPLANT
WATER STERILE IRR 1000ML POUR (IV SOLUTION) ×2 IMPLANT

## 2019-02-25 NOTE — Discharge Instructions (Signed)
You need to go to the ER   

## 2019-02-25 NOTE — Interval H&P Note (Signed)
History and Physical Interval Note:  02/25/2019 6:30 PM  Lori Jackson  has presented today for surgery, with the diagnosis of appendicitis.  The various methods of treatment have been discussed with the patient and family. After consideration of risks, benefits and other options for treatment, the patient has consented to  Procedure(s): APPENDECTOMY LAPAROSCOPIC (N/A) as a surgical intervention.  The patient's history has been reviewed, patient examined, no change in status, stable for surgery.  I have reviewed the patient's chart and labs.  Questions were answered to the patient's satisfaction.    Pt seen examined agree  Options of medical and surgical manangement offered  Laparoscopic appendectomy The procedure has been discussed with the patient.  Alternative therapies have been discussed with the patient.  Operative risks include bleeding,  Infection,  Organ injury,  Nerve injury,  Blood vessel injury,  DVT,  Pulmonary embolism,  Death,  And possible reoperation.  Medical management risks include worsening of present situation.  The success of the procedure is 50 -90 % at treating patients symptoms.  The patient understands and agrees to proceed.    Jaidalyn Schillo A Shaina Gullatt

## 2019-02-25 NOTE — Anesthesia Procedure Notes (Signed)
Procedure Name: Intubation Date/Time: 02/25/2019 6:39 PM Performed by: Myna Bright, CRNA Pre-anesthesia Checklist: Patient identified, Emergency Drugs available, Suction available and Patient being monitored Patient Re-evaluated:Patient Re-evaluated prior to induction Oxygen Delivery Method: Circle system utilized Preoxygenation: Pre-oxygenation with 100% oxygen Induction Type: IV induction and Rapid sequence Laryngoscope Size: Mac and 3 Grade View: Grade II Tube type: Oral Tube size: 7.0 mm Number of attempts: 1 Airway Equipment and Method: Stylet Placement Confirmation: ETT inserted through vocal cords under direct vision,  positive ETCO2 and breath sounds checked- equal and bilateral Secured at: 21 cm Tube secured with: Tape Dental Injury: Teeth and Oropharynx as per pre-operative assessment

## 2019-02-25 NOTE — H&P (Signed)
[facility] Surgery Admission Note  Lori Jackson [birth year]  [id number].    Requesting MD: Juleen China Chief Complaint/Reason for Consult: Acute appendicitis HPI:  Patient is an otherwise healthy 28 year old female who presented to [facility] from the urgent care with abdominal pain that started around 5:30 this morning. Pain woke her up.  She took ibuprofen, which initially helped. Pain described as diffuse but has since worsened in the lower abdomen. Associated nausea and vomiting. Denies diarrhea, fever, chills, SOB, cough, chest pain, urinary symptoms, but has had recent sinus troubles. Patient has had 3 past cesarean sections. She went to urgent care and was sent to the ED due to concerns for acute appendicitis.  Upon arrival, she was noted to have a WBC of 15K and a CT scan showing acute appendicitis.  We have been asked to see her for admission.  ROS: Review of Systems  Constitutional: Negative for chills and fever.  HENT: Positive for congestion and sinus pain.   Respiratory: Negative for cough and shortness of breath.   Cardiovascular: Negative for chest pain.  Gastrointestinal: Positive for abdominal pain, nausea and vomiting. Negative for diarrhea.  Genitourinary: Negative for dysuria.  All other systems reviewed and are negative.   Family History  Problem Relation Age of Onset  . Diabetes Paternal Grandmother   . Hypertension Paternal Grandmother     Past Medical History:  Diagnosis Date  . No pertinent past medical history     Past Surgical History:  Procedure Laterality Date  . CESAREAN SECTION    . CESAREAN SECTION  08/14/2012   Procedure: CESAREAN SECTION;  Surgeon: Kathreen Cosier, MD;  Location: [facility];  Service: Obstetrics;  Laterality: N/A;  . WISDOM TOOTH EXTRACTION      Social History:  reports that she has never smoked. She has never used smokeless tobacco. She reports that she does not drink alcohol or use drugs.  Allergies:  Allergies  Allergen  Reactions  . Codeine Rash    Pt can take Vicodin    (Not in a hospital admission)   Blood pressure 91/66, pulse 85, temperature 98.2 F (36.8 C), temperature source Oral, resp. rate 18, height  (1.651 m), weight 70.3 kg, last menstrual period 01/26/2019, SpO2 100 %. Physical Exam: Physical Exam Constitutional:      General: She is not in acute distress.    Appearance: She is well-developed.  HENT:     Head: Normocephalic and atraumatic.  Eyes:     General: No scleral icterus.    Extraocular Movements: Extraocular movements intact.  Cardiovascular:     Rate and Rhythm: Normal rate and regular rhythm.     Heart sounds: No murmur. No friction rub. No gallop.   Pulmonary:     Effort: Pulmonary effort is normal. No respiratory distress.     Breath sounds: Normal breath sounds. No wheezing or rhonchi.  Abdominal:     General: A surgical scar is present. Bowel sounds are normal. There is no distension.     Palpations: Abdomen is soft.     Tenderness: There is abdominal tenderness in the right lower quadrant. Positive signs include McBurney's sign.     Hernia: No hernia is present.  Musculoskeletal:     Comments: ROM grossly intact in all 4 extremities  Skin:    General: Skin is warm and dry.     Comments: No masses, lesions, or rashes  Neurological:     General: No focal deficit present.  Mental Status: She is alert.  Psychiatric:        Mood and Affect: Mood normal.        Behavior: Behavior normal.     Comments: Alert and oriented x3     Results for orders placed or performed during the hospital encounter of 02/25/19 (from the past 48 hour(s))  CBC with Differential     Status: Abnormal   Collection Time: 02/25/19  1:20 PM  Result Value Ref Range   WBC 15.4 (H) 4.0 - 10.5 K/uL   RBC 4.09 3.87 - 5.11 MIL/uL   Hemoglobin 11.2 (L) 12.0 - 15.0 g/dL   HCT 14.4 (L) 81.8 - 56.3 %   MCV 86.6 80.0 - 100.0 fL   MCH 27.4 26.0 - 34.0 pg   MCHC 31.6 30.0 - 36.0 g/dL    RDW 14.9 70.2 - 63.7 %   Platelets 227 150 - 400 K/uL   nRBC 0.0 0.0 - 0.2 %   Neutrophils Relative % 95 %   Neutro Abs 14.6 (H) 1.7 - 7.7 K/uL   Lymphocytes Relative 2 %   Lymphs Abs 0.3 (L) 0.7 - 4.0 K/uL   Monocytes Relative 3 %   Monocytes Absolute 0.4 0.1 - 1.0 K/uL   Eosinophils Relative 0 %   Eosinophils Absolute 0.0 0.0 - 0.5 K/uL   Basophils Relative 0 %   Basophils Absolute 0.0 0.0 - 0.1 K/uL   Immature Granulocytes 0 %   Abs Immature Granulocytes 0.06 0.00 - 0.07 K/uL    Comment: Performed at [facility], [address] [place], [place] [address]  Comprehensive metabolic panel     Status: Abnormal   Collection Time: 02/25/19  1:20 PM  Result Value Ref Range   Sodium 138 135 - 145 mmol/L   Potassium 3.8 3.5 - 5.1 mmol/L   Chloride 107 98 - 111 mmol/L   CO2 22 22 - 32 mmol/L   Glucose, Bld 152 (H) 70 - 99 mg/dL   BUN 11 6 - 20 mg/dL   Creatinine, Ser 0.27 0.44 - 1.00 mg/dL   Calcium 9.2 8.9 - 74.1 mg/dL   Total Protein 6.9 6.5 - 8.1 g/dL   Albumin 3.9 3.5 - 5.0 g/dL   AST 21 15 - 41 U/L   ALT 19 0 - 44 U/L   Alkaline Phosphatase 48 38 - 126 U/L   Total Bilirubin 0.8 0.3 - 1.2 mg/dL   GFR calc non Af Amer >60 >60 mL/min   GFR calc Af Amer >60 >60 mL/min   Anion gap 9 5 - 15    Comment: Performed at [facility], [address], [place], [place] [address]  Lipase, blood     Status: None   Collection Time: 02/25/19  1:20 PM  Result Value Ref Range   Lipase 25 11 - 51 U/L    Comment: Performed at [facility], [address] [place], [place] [address]  POC Urine Pregnancy, ED (not at [facility])     Status: None   Collection Time: 02/25/19  2:51 PM  Result Value Ref Range   Preg Test, Ur NEGATIVE NEGATIVE    Comment:        THE SENSITIVITY OF THIS METHODOLOGY IS >24 mIU/mL    Ct Abdomen Pelvis W Contrast  Result Date: 02/25/2019 CLINICAL DATA:  Abdominal pain, vomiting EXAM: CT ABDOMEN AND PELVIS WITH CONTRAST TECHNIQUE: Multidetector CT imaging  of the abdomen and pelvis was performed using  the standard protocol following bolus administration of intravenous contrast. CONTRAST:  100mL OMNIPAQUE IOHEXOL 300 MG/ML  SOLN COMPARISON:  None. FINDINGS: Lower chest: Lung bases are clear. No effusions. Heart is normal size. Hepatobiliary: Diffuse low-density throughout the liver compatible with fatty infiltration. No focal abnormality. Gallbladder unremarkable. Pancreas: No focal abnormality or ductal dilatation. Spleen: No focal abnormality.  Normal size. Adrenals/Urinary Tract: Punctate nonobstructing stone in the lower pole of the left kidney. No ureteral stones or hydronephrosis. Urinary bladder and adrenal glands are unremarkable. Stomach/Bowel: Large stool burden throughout the colon. No evidence of bowel obstruction. Appendix is mildly prominent, measuring 10 mm, best seen on coronal image 36. No surrounding inflammation. Vascular/Lymphatic: No evidence of aneurysm or adenopathy. Reproductive: Uterus and adnexa unremarkable.  No mass. Other: Trace free fluid in the pelvis.  No free air. Musculoskeletal: No acute bony abnormality. IMPRESSION: Mildly dilated appendix, measuring up to 10 mm without surrounding inflammation. Recommend clinical correlation to exclude early appendicitis. Large stool burden throughout the colon. Mild fatty infiltration of the liver. Punctate left lower pole nephrolithiasis. Electronically Signed   By: Charlett NoseKevin  Dover M.D.   On: 02/25/2019 15:50      Assessment/Plan Acute appendicitis - WBC 15 and exam consistent with acute appendicitis. - CT scan equivocal for appendicitis - to OR for laparoscopic appendectomy  -she will need COVID test first. I have discussed the procedure and risks of appendectomy. The risks include but are not limited to bleeding, infection, wound problems, anesthesia, injury to intra-abdominal organs, possibility of postoperative ileus. She seems to understand and agrees with the plan. -she has given us  her husband, Lori Jackson, phone number and we will contact him following surgery. -expected recovery discussed with her as well.  All questions were answered. -rocephin/flagyl  Letha CapeKelly E Avishai Reihl 4:29 PM 02/25/2019

## 2019-02-25 NOTE — ED Notes (Signed)
Patient is being transferred from the Urgent Care Center to the Emergency Department via wheelchair by staff. Patient is stable but in need of higher level of care due to severe abdominal pain. Patient is aware and verbalizes understanding of plan of care.  Vitals:   02/25/19 1143  BP: (!) 101/54  Pulse: 94  Resp: 20  Temp: 98.7 F (37.1 C)  SpO2: 100%

## 2019-02-25 NOTE — Anesthesia Preprocedure Evaluation (Signed)
Anesthesia Evaluation  Patient identified by MRN, date of birth, ID band Patient awake    Reviewed: Allergy & Precautions, NPO status , Patient's Chart, lab work & pertinent test results  Airway Mallampati: II  TM Distance: >3 FB Neck ROM: Full    Dental  (+) Teeth Intact   Pulmonary neg pulmonary ROS,    breath sounds clear to auscultation       Cardiovascular negative cardio ROS   Rhythm:Regular     Neuro/Psych negative neurological ROS  negative psych ROS   GI/Hepatic Neg liver ROS, appy   Endo/Other  negative endocrine ROS  Renal/GU negative Renal ROS     Musculoskeletal negative musculoskeletal ROS (+)   Abdominal   Peds  Hematology negative hematology ROS (+)   Anesthesia Other Findings   Reproductive/Obstetrics                             Anesthesia Physical Anesthesia Plan  ASA: I  Anesthesia Plan: General   Post-op Pain Management:    Induction: Intravenous, Rapid sequence and Cricoid pressure planned  PONV Risk Score and Plan: 3 and Ondansetron and Dexamethasone  Airway Management Planned: Oral ETT  Additional Equipment: None  Intra-op Plan:   Post-operative Plan: Extubation in OR  Informed Consent: I have reviewed the patients History and Physical, chart, labs and discussed the procedure including the risks, benefits and alternatives for the proposed anesthesia with the patient or authorized representative who has indicated his/her understanding and acceptance.     Dental advisory given  Plan Discussed with: CRNA and Surgeon  Anesthesia Plan Comments:         Anesthesia Quick Evaluation

## 2019-02-25 NOTE — Op Note (Signed)
Appendectomy, Lap, Procedure Note  Indications: The patient presented with a history of right-sided abdominal pain. A CT scan revealed findings consistent with acute appendicitis.  History and exam were performed.  This confirmed the diagnosis.  Operative and nonoperative management strategies discussed and she agreed to proceed with laparoscopic appendectomy.The procedure has been discussed with the patient.  Alternative therapies have been discussed with the patient.  Operative risks include bleeding,  Infection,  Organ injury,  Nerve injury,  Blood vessel injury,  DVT,  Pulmonary embolism,  Death,  And possible reoperation.  Medical management risks include worsening of present situation.  The success of the procedure is 50 -90 % at treating patients symptoms.  The patient understands and agrees to proceed.  Pre-operative Diagnosis: Acute appendicitis without mention of peritonitis  Post-operative Diagnosis: Same  Surgeon: Dortha Schwalbehomas A Shiza Thelen  MD  Assistants: NONE  Anesthesia: General endotracheal anesthesia and Local anesthesia 0.25.% bupivacaine, with epinephrine  ASA Class: 1  Procedure Details  The patient was seen again in the Holding Room. The risks, benefits, complications, treatment options, and expected outcomes were discussed with the patient and/or family. The possibilities of reaction to medication, pulmonary aspiration, perforation of viscus, bleeding, recurrent infection, finding a normal appendix, the need for additional procedures, failure to diagnose a condition, and creating a complication requiring transfusion or operation were discussed. There was concurrence with the proposed plan and informed consent was obtained. The site of surgery was properly noted/marked. The patient was taken to Operating Room, identified as Lori Jackson and the procedure verified as Appendectomy. A Time Out was held and the above information confirmed.  The patient was placed in the supine  position and general anesthesia was induced, along with placement of orogastric tube, Venodyne boots, and a Foley catheter. The abdomen was prepped and draped in a sterile fashion. A one centimeter infraumbilical incision was made and the peritoneal cavity was accessed using the OPEN  technique. The pneumoperitoneum was then established to steady pressure of 12 mmHg. A 12 mm port was placed through the umbilical incision. Additional 5 mm cannulas then placed in the left lower quadrant of the abdomen and half way between the umbilicus and xiphoid under direct vision. A careful evaluation of the entire abdomen was carried out. The patient was placed in Trendelenburg and left lateral decubitus position. The small intestines were retracted in the cephalad and left lateral direction away from the pelvis and right lower quadrant. The patient was found to have an enlarged and inflamed appendix that was extending into the pelvis. There was no evidence of perforation.  The appendix was carefully dissected. A window was made in the mesoappendix at the base of the appendix. A harmonic scalpel was used across the mesoappendix. The appendix was divided at its base using an endo-GIA stapler. Minimal appendiceal stump was left in place. There was no evidence of bleeding, leakage, or complication after division of the appendix. Irrigation was also performed and irrigate suctioned from the abdomen as well.  The umbilical port site was closed using 0 vicryl pursestring sutures fashion at the level of the fascia. The trocar site skin wounds were closed using skin staples.  Instrument, sponge, and needle counts were correct at the conclusion of the case.   Findings: The appendix was found to be inflamed. There were not signs of necrosis.  There was not perforation. There was not abscess formation.  Estimated Blood Loss:  less than 50 mL  Drains: None         Total IV Fluids: Per record         Specimens:  Appendix to pathology         Complications:  None; patient tolerated the procedure well.         Disposition: PACU - hemodynamically stable.         Condition: stable

## 2019-02-25 NOTE — ED Triage Notes (Signed)
PT sent from [facility] with reported ABD pain that started at 0530 this morning and vomiting started at 0730. Lori Jackson

## 2019-02-25 NOTE — ED Provider Notes (Signed)
[facility]    CSN: [id number] Arrival date & time: 02/25/19  1108     History   Chief Complaint Chief Complaint  Patient presents with   Abdominal Pain    HPI Lori Jackson is a 28 y.o. female.   HPI  Patient states that she was well yesterday.  She woke up this morning with abdominal pain.  She is having crampy abdominal pain is throughout her abdomen.  She started vomiting.  Having protracted vomiting.  Comes in a wheelchair because she is unable to stand and walk.  States the pain is severe.  Uncertain if she is had any fever .,  Is observed having shaking chills in the exam room.  Past Medical History:  Diagnosis Date   No pertinent past medical history     Patient Active Problem List   Diagnosis Date Noted   Acute appendicitis 02/25/2019    Past Surgical History:  Procedure Laterality Date   CESAREAN SECTION     CESAREAN SECTION  08/14/2012   Procedure: CESAREAN SECTION;  Surgeon: Kathreen Cosier, MD;  Location: [facility];  Service: Obstetrics;  Laterality: N/A;   WISDOM TOOTH EXTRACTION      OB History    Gravida  3   Para  3   Term  2   Preterm  1   AB      Living  3     SAB      TAB      Ectopic      Multiple      Live Births  3            Home Medications    Prior to Admission medications   Medication Sig Start Date End Date Taking? Authorizing Provider  Multiple Vitamins-Calcium (ONE-A-DAY WOMENS PO) Take 1 tablet by mouth daily.    [provider]    Family History Family History  Problem Relation Age of Onset   Diabetes Paternal Grandmother    Hypertension Paternal Grandmother     Social History Social History   Tobacco Use   Smoking status: Never Smoker   Smokeless tobacco: Never Used  Substance Use Topics   Alcohol use: No   Drug use: No     Allergies   Codeine   Review of Systems Review of Systems  Constitutional: Negative for chills and fever.  HENT: Negative for  ear pain and sore throat.   Eyes: Negative for pain and visual disturbance.  Respiratory: Negative for cough and shortness of breath.   Cardiovascular: Negative for chest pain and palpitations.  Gastrointestinal: Positive for abdominal pain, nausea and vomiting.  Genitourinary: Negative for dysuria and hematuria.  Musculoskeletal: Negative for arthralgias and back pain.  Skin: Negative for color change and rash.  Neurological: Negative for seizures and syncope.  All other systems reviewed and are negative.    Physical Exam Triage Vital Signs ED Triage Vitals  Enc Vitals Group     BP 02/25/19 1143 (!) 101/54     Pulse Rate 02/25/19 1143 94     Resp 02/25/19 1143 20     Temp 02/25/19 1143 98.7 F (37.1 C)     Temp Source 02/25/19 1143 Oral     SpO2 02/25/19 1143 100 %     Weight --      Height --      Head Circumference --      Peak Flow --      Pain Score 02/25/19 1139  10     Pain Loc --      Pain Edu? --      Excl. in GC? --    No data found.  Updated Vital Signs BP (!) 101/54 (BP Location: Left Arm)    Pulse 94    Temp 98.7 F (37.1 C) (Oral)    Resp 20    LMP 01/26/2019    SpO2 100%   Visual Acuity Right Eye Distance:   Left Eye Distance:   Bilateral Distance:    Right Eye Near:   Left Eye Near:    Bilateral Near:     Physical Exam Constitutional:      General: She is in acute distress.     Appearance: She is well-developed and normal weight. She is ill-appearing.  HENT:     Head: Normocephalic and atraumatic.  Eyes:     Conjunctiva/sclera: Conjunctivae normal.     Pupils: Pupils are equal, round, and reactive to light.  Neck:     Musculoskeletal: Normal range of motion.  Cardiovascular:     Rate and Rhythm: Normal rate.  Pulmonary:     Effort: Pulmonary effort is normal. No respiratory distress.  Abdominal:     General: Bowel sounds are decreased. There is no distension.     Palpations: Abdomen is soft.     Tenderness: There is generalized  abdominal tenderness. There is guarding and rebound.     Comments: Acute abdominal pain.  Extremely uncomfortable.  Would not allow deep palpation.  Musculoskeletal: Normal range of motion.  Skin:    General: Skin is warm and dry.  Neurological:     General: No focal deficit present.     Mental Status: She is alert.  Psychiatric:        Mood and Affect: Mood normal.        Behavior: Behavior normal.      UC Treatments / Results  Labs (all labs ordered are listed, but only abnormal results are displayed) Labs Reviewed - No data to display  EKG None  Radiology Ct Abdomen Pelvis W Contrast  Result Date: 02/25/2019 CLINICAL DATA:  Abdominal pain, vomiting EXAM: CT ABDOMEN AND PELVIS WITH CONTRAST TECHNIQUE: Multidetector CT imaging of the abdomen and pelvis was performed using the standard protocol following bolus administration of intravenous contrast. CONTRAST:  100mL OMNIPAQUE IOHEXOL 300 MG/ML  SOLN COMPARISON:  None. FINDINGS: Lower chest: Lung bases are clear. No effusions. Heart is normal size. Hepatobiliary: Diffuse low-density throughout the liver compatible with fatty infiltration. No focal abnormality. Gallbladder unremarkable. Pancreas: No focal abnormality or ductal dilatation. Spleen: No focal abnormality.  Normal size. Adrenals/Urinary Tract: Punctate nonobstructing stone in the lower pole of the left kidney. No ureteral stones or hydronephrosis. Urinary bladder and adrenal glands are unremarkable. Stomach/Bowel: Large stool burden throughout the colon. No evidence of bowel obstruction. Appendix is mildly prominent, measuring 10 mm, best seen on coronal image 36. No surrounding inflammation. Vascular/Lymphatic: No evidence of aneurysm or adenopathy. Reproductive: Uterus and adnexa unremarkable.  No mass. Other: Trace free fluid in the pelvis.  No free air. Musculoskeletal: No acute bony abnormality. IMPRESSION: Mildly dilated appendix, measuring up to 10 mm without surrounding  inflammation. Recommend clinical correlation to exclude early appendicitis. Large stool burden throughout the colon. Mild fatty infiltration of the liver. Punctate left lower pole nephrolithiasis. Electronically Signed   By: Charlett NoseKevin  Dover M.D.   On: 02/25/2019 15:50    Procedures Procedures (including critical care time)  Medications Ordered in UC  Medications  ondansetron (ZOFRAN-ODT) disintegrating tablet 4 mg (4 mg Oral Given 02/25/19 1152)    Initial Impression / Assessment and Plan / UC Course  I have reviewed the triage vital signs and the nursing notes.  Pertinent labs & imaging results that were available during my care of the patient were reviewed by me and considered in my medical decision making (see chart for details).     It is clear the patient has abdominal pain beyond what can be managed at the urgent care center.  I am sending her directly down to the emergency room for care Final Clinical Impressions(s) / UC Diagnoses   Final diagnoses:  Generalized abdominal pain  Chills (without fever)  Nausea and vomiting, intractability of vomiting not specified, unspecified vomiting type     Discharge Instructions     You need to go to the ER   ED Prescriptions    None     Controlled Substance Prescriptions [place] Controlled Substance Registry consulted? Not Applicable   Eustace Moore, MD 02/25/19 [phone]

## 2019-02-25 NOTE — OR Nursing (Signed)
Lori Jackson did specimen time-out with [facility]

## 2019-02-25 NOTE — ED Triage Notes (Addendum)
Woke at 5:30 am with abdominal pain, at 7:00 am started vomiting.  No diarrhea.  Reports 7 episodes of vomiting  Denies cough, cold symptoms

## 2019-02-25 NOTE — ED Provider Notes (Signed)
[facility] EMERGENCY DEPARTMENT Provider Note   CSN: [id number] Arrival date & time: 02/25/19  1243   History   Chief Complaint Chief Complaint  Patient presents with  . Abdominal Pain    HPI Lori Jackson is a 28 y.o. female.     HPI  Patient notes this morning around 5:30 AM she developed diffuse abdominal pain nausea vomiting.  She denies any diarrhea dysuria or vaginal bleeding or discharge.  She notes the pain is worse along the lower abdomen and along the epigastric region as well.  She notes she is unable to tolerate p.o.  She notes her last solid intake was last night, she attempted drinking liquids this morning.  Notes she is not pregnant.  She denies any fever.  No history of abdominal surgeries.  Past Medical History:  Diagnosis Date  . No pertinent past medical history     There are no active problems to display for this patient.   Past Surgical History:  Procedure Laterality Date  . CESAREAN SECTION    . CESAREAN SECTION  08/14/2012   Procedure: CESAREAN SECTION;  Surgeon: Kathreen Cosier, MD;  Location: [facility];  Service: Obstetrics;  Laterality: N/A;  . WISDOM TOOTH EXTRACTION       OB History    Gravida  3   Para  3   Term  2   Preterm  1   AB      Living  3     SAB      TAB      Ectopic      Multiple      Live Births  3            Home Medications    Prior to Admission medications   Medication Sig Start Date End Date Taking? Authorizing Provider  Multiple Vitamins-Calcium (ONE-A-DAY WOMENS PO) Take 1 tablet by mouth daily.   Yes [provider]    Family History Family History  Problem Relation Age of Onset  . Diabetes Paternal Grandmother   . Hypertension Paternal Grandmother     Social History Social History   Tobacco Use  . Smoking status: Never Smoker  . Smokeless tobacco: Never Used  Substance Use Topics  . Alcohol use: No  . Drug use: No     Allergies   Codeine    Review of Systems Review of Systems  All other systems reviewed and are negative.    Physical Exam Updated Vital Signs BP 91/66   Pulse 85   Temp 98.2 F (36.8 C) (Oral)   Resp 18   Ht 5\' 5"  (1.651 m)   Wt 70.3 kg   LMP 01/26/2019   SpO2 100%   BMI 25.79 kg/m   Physical Exam Vitals signs and nursing note reviewed.  Constitutional:      Appearance: She is well-developed.  HENT:     Head: Normocephalic and atraumatic.  Eyes:     General: No scleral icterus.       Right eye: No discharge.        Left eye: No discharge.     Conjunctiva/sclera: Conjunctivae normal.     Pupils: Pupils are equal, round, and reactive to light.  Neck:     Musculoskeletal: Normal range of motion.     Vascular: No JVD.     Trachea: No tracheal deviation.  Pulmonary:     Effort: Pulmonary effort is normal.     Breath sounds: No stridor.  Abdominal:     Comments: Tenderness palpation of bilateral lower abdomen worse in the right lower quadrant-no rebound or guarding  Neurological:     Mental Status: She is alert and oriented to person, place, and time.     Coordination: Coordination normal.  Psychiatric:        Behavior: Behavior normal.        Thought Content: Thought content normal.        Judgment: Judgment normal.      ED Treatments / Results  Labs (all labs ordered are listed, but only abnormal results are displayed) Labs Reviewed  CBC WITH DIFFERENTIAL/PLATELET - Abnormal; Notable for the following components:      Result Value   WBC 15.4 (*)    Hemoglobin 11.2 (*)    HCT 35.4 (*)    Neutro Abs 14.6 (*)    Lymphs Abs 0.3 (*)    All other components within normal limits  COMPREHENSIVE METABOLIC PANEL - Abnormal; Notable for the following components:   Glucose, Bld 152 (*)    All other components within normal limits  SARS CORONAVIRUS 2 (HOSPITAL ORDER, PERFORMED IN [facility])  LIPASE, BLOOD  POC URINE PREG, ED    EKG None  Radiology Ct Abdomen Pelvis  W Contrast  Result Date: 02/25/2019 CLINICAL DATA:  Abdominal pain, vomiting EXAM: CT ABDOMEN AND PELVIS WITH CONTRAST TECHNIQUE: Multidetector CT imaging of the abdomen and pelvis was performed using the standard protocol following bolus administration of intravenous contrast. CONTRAST:  OMNIPAQUE IOHEXOL 300 MG/ML  SOLN COMPARISON:  None. FINDINGS: Lower chest: Lung bases are clear. No effusions. Heart is normal size. Hepatobiliary: Diffuse low-density throughout the liver compatible with fatty infiltration. No focal abnormality. Gallbladder unremarkable. Pancreas: No focal abnormality or ductal dilatation. Spleen: No focal abnormality.  Normal size. Adrenals/Urinary Tract: Punctate nonobstructing stone in the lower pole of the left kidney. No ureteral stones or hydronephrosis. Urinary bladder and adrenal glands are unremarkable. Stomach/Bowel: Large stool burden throughout the colon. No evidence of bowel obstruction. Appendix is mildly prominent, measuring 10 mm, best seen on coronal image 36. No surrounding inflammation. Vascular/Lymphatic: No evidence of aneurysm or adenopathy. Reproductive: Uterus and adnexa unremarkable.  No mass. Other: Trace free fluid in the pelvis.  No free air. Musculoskeletal: No acute bony abnormality. IMPRESSION: Mildly dilated appendix, measuring up to 10 mm without surrounding inflammation. Recommend clinical correlation to exclude early appendicitis. Large stool burden throughout the colon. Mild fatty infiltration of the liver. Punctate left lower pole nephrolithiasis. Electronically Signed   By: Charlett Nose M.D.   On: 02/25/2019 15:50    Procedures Procedures (including critical care time)  Medications Ordered in ED Medications  cefTRIAXone (ROCEPHIN) 2 g in sodium chloride 0.9 % 100 mL IVPB (has no administration in time range)    And  metroNIDAZOLE (FLAGYL) IVPB 500 mg (has no administration in time range)  sodium chloride 0.9 % bolus 1,000 mL (1,000 mLs  Intravenous New Bag/Given 02/25/19 1313)  fentaNYL (SUBLIMAZE) injection 50 mcg (50 mcg Intravenous Given 02/25/19 1317)  iohexol (OMNIPAQUE) 300 MG/ML solution 100 mL (100 mLs Intravenous Contrast Given 02/25/19 1532)     Initial Impression / Assessment and Plan / ED Course  I have reviewed the triage vital signs and the nursing notes.  Pertinent labs & imaging results that were available during my care of the patient were reviewed by me and considered in my medical decision making (see chart for details).  Labs: CBC, CMP, lipase, point-of-care urine pregnant  Imaging: CT abdomen pelvis with contrast  Consults: General Surgery   Therapeutics: Fentanyl (Zofran prior to arrival)  Discharge Meds:   Assessment/Plan: 28 year old female presents today with abdominal pain.  Patient has elevated white count localized pain to the right lower quadrant.  CT showing dilated appendix.  Patient did have some improvement pain with Dilaudid here.  Nausea controlled with Zofran.  Given clinical presentation general surgery called for early appendicitis.  Surgery at bedside.   Final Clinical Impressions(s) / ED Diagnoses   Final diagnoses:  Appendicitis, unspecified appendicitis type    ED Discharge Orders    None       Rosalio LoudHedges, Orey Moure, PA-C 02/25/19 1614    Raeford RazorKohut, Stephen, MD 02/26/19 [id number]

## 2019-02-26 ENCOUNTER — Encounter (HOSPITAL_COMMUNITY): Payer: Self-pay | Admitting: Surgery

## 2019-02-26 LAB — BASIC METABOLIC PANEL
Anion gap: 5 (ref 5–15)
BUN: 5 mg/dL — ABNORMAL LOW (ref 6–20)
CO2: 22 mmol/L (ref 22–32)
Calcium: 8.3 mg/dL — ABNORMAL LOW (ref 8.9–10.3)
Chloride: 109 mmol/L (ref 98–111)
Creatinine, Ser: 0.64 mg/dL (ref 0.44–1.00)
GFR calc Af Amer: >60 mL/min (ref >60)
GFR calc non Af Amer: >60 mL/min (ref >60)
Glucose, Bld: 183 mg/dL — ABNORMAL HIGH (ref 70–99)
Potassium: 3.6 mmol/L (ref 3.5–5.1)
Sodium: 136 mmol/L (ref 135–145)

## 2019-02-26 LAB — CBC
HCT: 31.6 % — ABNORMAL LOW (ref 36.0–46.0)
Hemoglobin: 10.1 g/dL — ABNORMAL LOW (ref 12.0–15.0)
MCH: 27.1 pg (ref 26.0–34.0)
MCHC: 32 g/dL (ref 30.0–36.0)
MCV: 84.7 fL (ref 80.0–100.0)
Platelets: 213 10*3/uL (ref 150–400)
RBC: 3.73 MIL/uL — ABNORMAL LOW (ref 3.87–5.11)
RDW: 13.3 % (ref 11.5–15.5)
WBC: 14.3 10*3/uL — ABNORMAL HIGH (ref 4.0–10.5)
nRBC: 0 % (ref 0.0–0.2)

## 2019-02-26 MED ORDER — IBUPROFEN 600 MG PO TABS: 600.0000 mg | ORAL_TABLET | Freq: Four times a day (QID) | ORAL | Status: DC | PRN

## 2019-02-26 MED ORDER — TRAMADOL HCL 50 MG PO TABS: 50.0000 mg | ORAL_TABLET | Freq: Four times a day (QID) | ORAL | 0 refills | Status: DC | PRN

## 2019-02-26 MED ORDER — ACETAMINOPHEN 500 MG PO TABS: 1000.0000 mg | ORAL_TABLET | Freq: Three times a day (TID) | ORAL | Status: DC | PRN

## 2019-02-26 NOTE — Discharge Summary (Signed)
[facility] Discharge Summary   Patient ID: Lori Jackson MRN: [id number] DOB/AGE: 1991/04/02 27 y.o.  Admit date: 02/25/2019 Discharge date: 02/26/2019  Admitting Diagnosis: Acute appendicitis  Discharge Diagnosis Patient Active Problem List   Diagnosis Date Noted  . Acute appendicitis 02/25/2019    Consultants None  Imaging: Ct Abdomen Pelvis W Contrast  Result Date: 02/25/2019 CLINICAL DATA:  Abdominal pain, vomiting EXAM: CT ABDOMEN AND PELVIS WITH CONTRAST TECHNIQUE: Multidetector CT imaging of the abdomen and pelvis was performed using the standard protocol following bolus administration of intravenous contrast. CONTRAST:  OMNIPAQUE IOHEXOL 300 MG/ML  SOLN COMPARISON:  None. FINDINGS: Lower chest: Lung bases are clear. No effusions. Heart is normal size. Hepatobiliary: Diffuse low-density throughout the liver compatible with fatty infiltration. No focal abnormality. Gallbladder unremarkable. Pancreas: No focal abnormality or ductal dilatation. Spleen: No focal abnormality.  Normal size. Adrenals/Urinary Tract: Punctate nonobstructing stone in the lower pole of the left kidney. No ureteral stones or hydronephrosis. Urinary bladder and adrenal glands are unremarkable. Stomach/Bowel: Large stool burden throughout the colon. No evidence of bowel obstruction. Appendix is mildly prominent, measuring 10 mm, best seen on coronal image 36. No surrounding inflammation. Vascular/Lymphatic: No evidence of aneurysm or adenopathy. Reproductive: Uterus and adnexa unremarkable.  No mass. Other: Trace free fluid in the pelvis.  No free air. Musculoskeletal: No acute bony abnormality. IMPRESSION: Mildly dilated appendix, measuring up to 10 mm without surrounding inflammation. Recommend clinical correlation to exclude early appendicitis. Large stool burden throughout the colon. Mild fatty infiltration of the liver. Punctate left lower pole nephrolithiasis. Electronically Signed   By:  Charlett Nose M.D.   On: 02/25/2019 15:50    Procedures Dr. Luisa Hart (02/25/19) - Laparoscopic Appendectomy  Hospital Course:  Patient is a 28 year old female who presented to [facility] from urgent care with abdominal pain.  Workup showed acute appendicitis.  Patient was admitted and underwent procedure listed above.  Tolerated procedure well and was transferred to the floor.  Diet was advanced as tolerated.  On POD#1, the patient was voiding well, tolerating diet, ambulating well, pain well controlled, vital signs stable, incisions c/d/i and felt stable for discharge home.  Patient will follow up with our office in 2 weeks and knows to call with questions or concerns.  She will call to confirm appointment date/time.    Physical Exam: General:  Alert, NAD, pleasant, comfortable Abd:  Soft, ND, mild tenderness, incisions C/D/I   Allergies as of 02/26/2019      Reactions   Codeine Rash   Pt can take Vicodin      Medication List    TAKE these medications   acetaminophen 500 MG tablet Commonly known as:  TYLENOL Take 2 tablets (1,000 mg total) by mouth every 8 (eight) hours as needed for mild pain.   ibuprofen 600 MG tablet Commonly known as:  ADVIL Take 1 tablet (600 mg total) by mouth every 6 (six) hours as needed (for mild pain not relieved by other medications.).   ONE-A-DAY WOMENS PO Take 1 tablet by mouth daily.   traMADol 50 MG tablet Commonly known as:  ULTRAM Take 1 tablet (50 mg total) by mouth every 6 (six) hours as needed (mild pain).        Follow-up Information    [facility]. Call on 03/12/2019.   Specialty:  General Surgery Why:  Post-op follow up scheduled for 9:45 AM. A provider will call you during scheduled appointment time. Please send a photo of  incisions with name and DOB to [email or web address] the day prior to appointment time.  Contact information: [address] [place] [id number] [phone]            Signed: Wells GuilesKelly Rayburn, [facility] 02/26/2019, 9:25 AM Pager: [phone] Consults: [phone]

## 2019-02-26 NOTE — Plan of Care (Signed)

## 2019-02-26 NOTE — Discharge Instructions (Signed)
° ° °Managing Your Pain After Surgery Without Opioids ° ° ° °Thank you for participating in our program to help patients manage their pain after surgery without opioids. This is part of our effort to provide you with the best care possible, without exposing you or your family to the risk that opioids pose. ° °What pain can I expect after surgery? °You can expect to have some pain after surgery. This is normal. The pain is typically worse the day after surgery, and quickly begins to get better. °Many studies have found that many patients are able to manage their pain after surgery with Over-the-Counter (OTC) medications such as Tylenol and Motrin. If you have a condition that does not allow you to take Tylenol or Motrin, notify your surgical team. ° °How will I manage my pain? °The best strategy for controlling your pain after surgery is around the clock pain control with Tylenol (acetaminophen) and Motrin (ibuprofen or Advil). Alternating these medications with each other allows you to maximize your pain control. In addition to Tylenol and Motrin, you can use heating pads or ice packs on your incisions to help reduce your pain. ° °How will I alternate your regular strength over-the-counter pain medication? °You will take a dose of pain medication every three hours. °; Start by taking 650 mg of Tylenol (2 pills of 325 mg) °; 3 hours later take 600 mg of Motrin (3 pills of 200 mg) °; 3 hours after taking the Motrin take 650 mg of Tylenol °; 3 hours after that take 600 mg of Motrin. ° ° °- 1 - ° °See example - if your first dose of Tylenol is at 12:00 PM ° ° °12:00 PM Tylenol 650 mg (2 pills of 325 mg)  °3:00 PM Motrin 600 mg (3 pills of 200 mg)  °6:00 PM Tylenol 650 mg (2 pills of 325 mg)  °9:00 PM Motrin 600 mg (3 pills of 200 mg)  °Continue alternating every 3 hours  ° °We recommend that you follow this schedule around-the-clock for at least 3 days after surgery, or until you feel that it is no longer needed. Use  the table on the last page of this handout to keep track of the medications you are taking. °Important: °Do not take more than 3000mg of Tylenol or 3200mg of Motrin in a 24-hour period. °Do not take ibuprofen/Motrin if you have a history of bleeding stomach ulcers, severe kidney disease, &/or actively taking a blood thinner ° °What if I still have pain? °If you have pain that is not controlled with the over-the-counter pain medications (Tylenol and Motrin or Advil) you might have what we call “breakthrough” pain. You will receive a prescription for a small amount of an opioid pain medication such as Oxycodone, Tramadol, or Tylenol with Codeine. Use these opioid pills in the first 24 hours after surgery if you have breakthrough pain. Do not take more than 1 pill every 4-6 hours. ° °If you still have uncontrolled pain after using all opioid pills, don't hesitate to call our staff using the number provided. We will help make sure you are managing your pain in the best way possible, and if necessary, we can provide a prescription for additional pain medication. ° ° °Day 1   ° °Time  °Name of Medication Number of pills taken  °Amount of Acetaminophen  °Pain Level  ° °Comments  °AM PM       °AM PM       °AM PM       °  AM PM       °AM PM       °AM PM       °AM PM       °AM PM       °Total Daily amount of Acetaminophen °Do not take more than  3,000 mg per day    ° ° °Day 2   ° °Time  °Name of Medication Number of pills °taken  °Amount of Acetaminophen  °Pain Level  ° °Comments  °AM PM       °AM PM       °AM PM       °AM PM       °AM PM       °AM PM       °AM PM       °AM PM       °Total Daily amount of Acetaminophen °Do not take more than  3,000 mg per day    ° ° °Day 3   ° °Time  °Name of Medication Number of pills taken  °Amount of Acetaminophen  °Pain Level  ° °Comments  °AM PM       °AM PM       °AM PM       °AM PM       ° ° ° °AM PM       °AM PM       °AM PM       °AM PM       °Total Daily amount of Acetaminophen °Do  not take more than  3,000 mg per day    ° ° °Day 4   ° °Time  °Name of Medication Number of pills taken  °Amount of Acetaminophen  °Pain Level  ° °Comments  °AM PM       °AM PM       °AM PM       °AM PM       °AM PM       °AM PM       °AM PM       °AM PM       °Total Daily amount of Acetaminophen °Do not take more than  3,000 mg per day    ° ° °Day 5   ° °Time  °Name of Medication Number °of pills taken  °Amount of Acetaminophen  °Pain Level  ° °Comments  °AM PM       °AM PM       °AM PM       °AM PM       °AM PM       °AM PM       °AM PM       °AM PM       °Total Daily amount of Acetaminophen °Do not take more than  3,000 mg per day    ° ° ° °Day 6   ° °Time  °Name of Medication Number of pills °taken  °Amount of Acetaminophen  °Pain Level  °Comments  °AM PM       °AM PM       °AM PM       °AM PM       °AM PM       °AM PM       °AM PM       °AM PM       °Total Daily amount of Acetaminophen °Do not take more than    3,000 mg per day    ° ° °Day 7   ° °Time  °Name of Medication Number of pills taken  °Amount of Acetaminophen  °Pain Level  ° °Comments  °AM PM       °AM PM       °AM PM       °AM PM       °AM PM       °AM PM       °AM PM       °AM PM       °Total Daily amount of Acetaminophen °Do not take more than  3,000 mg per day    ° ° ° ° °For additional information about how and where to safely dispose of unused opioid °medications - https://www.morepowerfulnc.org ° °Disclaimer: This document contains information and/or instructional materials adapted from [facility] for the typical patient with your condition. It does not replace medical advice from your health care provider because your experience may differ from that of the °typical patient. Talk to your health care provider if you have any questions about this °document, your condition or your treatment plan. °Adapted from [facility] ° °[facility] °LAPAROSCOPIC SURGERY: POST OP INSTRUCTIONS °Always review your discharge  instruction sheet given to you by the facility where your surgery was performed. °IF YOU HAVE DISABILITY OR FAMILY LEAVE FORMS, YOU MUST BRING THEM TO THE OFFICE FOR PROCESSING.   °DO NOT GIVE THEM TO YOUR DOCTOR. ° °PAIN CONTROL ° °1. First take acetaminophen (Tylenol) AND/or ibuprofen (Advil) to control your pain after surgery.  Follow directions on package.  Taking acetaminophen (Tylenol) and/or ibuprofen (Advil) regularly after surgery will help to control your pain and lower the amount of prescription pain medication you may need.  You should not take more than 3,000 mg (3 grams) of acetaminophen (Tylenol) in 24 hours.  You should not take ibuprofen (Advil), aleve, motrin, naprosyn or other NSAIDS if you have a history of stomach ulcers or chronic kidney disease.  °2. A prescription for pain medication may be given to you upon discharge.  Take your pain medication as prescribed, if you still have uncontrolled pain after taking acetaminophen (Tylenol) or ibuprofen (Advil). °3. Use ice packs to help control pain. °4. If you need a refill on your pain medication, please contact your pharmacy.  They will contact our office to request authorization. Prescriptions will not be filled after 5pm or on week-ends. ° °HOME MEDICATIONS °5. Take your usually prescribed medications unless otherwise directed. ° °DIET °6. You should follow a light diet the first few days after arrival home.  Be sure to include lots of fluids daily. Avoid fatty, fried foods.  ° °CONSTIPATION °7. It is common to experience some constipation after surgery and if you are taking pain medication.  Increasing fluid intake and taking a stool softener (such as Colace) will usually help or prevent this problem from occurring.  A mild laxative (Milk of Magnesia or Miralax) should be taken according to package instructions if there are no bowel movements after 48 hours. ° °WOUND/INCISION CARE °8. Most patients will experience some swelling and bruising in  the area of the incisions.  Ice packs will help.  Swelling and bruising can take several days to resolve.  °9. Unless discharge instructions indicate otherwise, follow guidelines below  °a. STERI-STRIPS - you may remove your outer bandages 48 hours after surgery, and you may shower at that time.  You have steri-strips (  small skin tapes) in place directly over the incision.  These strips should be left on the skin for 7-10 days.   °b. DERMABOND/SKIN GLUE - you may shower in 24 hours.  The glue will flake off over the next 2-3 weeks. °10. Any sutures or staples will be removed at the office during your follow-up visit. ° °ACTIVITIES °11. You may resume regular (light) daily activities beginning the next day--such as daily self-care, walking, climbing stairs--gradually increasing activities as tolerated.  You may have sexual intercourse when it is comfortable.  Refrain from any heavy lifting or straining until approved by your doctor. °a. You may drive when you are no longer taking prescription pain medication, you can comfortably wear a seatbelt, and you can safely maneuver your car and apply brakes. ° °FOLLOW-UP °12. You should see your doctor in the office for a follow-up appointment approximately 2-3 weeks after your surgery.  You should have been given your post-op/follow-up appointment when your surgery was scheduled.  If you did not receive a post-op/follow-up appointment, make sure that you call for this appointment within a day or two after you arrive home to insure a convenient appointment time. ° ° °WHEN TO CALL YOUR DOCTOR: °1. Fever over 101.0 °2. Inability to urinate °3. Continued bleeding from incision. °4. Increased pain, redness, or drainage from the incision. °5. Increasing abdominal pain ° °The clinic staff is available to answer your questions during regular business hours.  Please don’t hesitate to call and ask to speak to one of the nurses for clinical concerns.  If you have a medical emergency,  go to the nearest emergency room or call 911.  A surgeon from [facility] is always on call at the hospital. °[address], [address], [place], [place]  [address] ? [address] [address], [place], [place]   [address] °[phone] ? [phone] ? FAX [phone] °Web site: [email or web address] ° °

## 2019-02-26 NOTE — Transfer of Care (Signed)
Immediate Anesthesia Transfer of Care Note  Patient: Lori Jackson  Procedure(s) Performed: APPENDECTOMY LAPAROSCOPIC (N/A Abdomen)  Patient Location: PACU  Anesthesia Type:General  Level of Consciousness: awake, alert , oriented and patient cooperative  Airway & Oxygen Therapy: Patient Spontanous Breathing and Patient connected to nasal cannula oxygen  Post-op Assessment: Report given to RN, Post -op Vital signs reviewed and stable and Patient moving all extremities  Post vital signs: Reviewed and stable  Last Vitals:  Vitals Value Taken Time  BP    Temp    Pulse    Resp    SpO2      Last Pain:  Vitals:   02/26/19 0755  TempSrc:   PainSc: 0-No pain      Patients Stated Pain Goal: 2 (02/25/19 2154)  Complications: No apparent anesthesia complications

## 2019-02-27 NOTE — Anesthesia Postprocedure Evaluation (Signed)
Anesthesia Post Note  Patient: Lori Jackson  Procedure(s) Performed: APPENDECTOMY LAPAROSCOPIC (N/A Abdomen)     Patient location during evaluation: PACU Anesthesia Type: General Level of consciousness: awake and alert Pain management: pain level controlled Vital Signs Assessment: post-procedure vital signs reviewed and stable Respiratory status: spontaneous breathing, nonlabored ventilation, respiratory function stable and patient connected to nasal cannula oxygen Cardiovascular status: blood pressure returned to baseline and stable Postop Assessment: no apparent nausea or vomiting Anesthetic complications: no    Last Vitals:  Vitals:   02/26/19 0019 02/26/19 0442  BP: (!) 104/54 96/60  Pulse: 91 76  Resp: 18 18  Temp: 36.7 C 36.8 C  SpO2: 100% 100%    Last Pain:  Vitals:   02/26/19 0755  TempSrc:   PainSc: 0-No pain                 [name]

## 2019-06-18 ENCOUNTER — Other Ambulatory Visit: Payer: Self-pay

## 2019-06-18 DIAGNOSIS — Z20822 Contact with and (suspected) exposure to covid-19: Secondary | ICD-10-CM

## 2019-06-19 IMAGING — CT CT ABDOMEN AND PELVIS WITH CONTRAST
2 of 4 series · 17 of 46 positions shown, 19 images · IV contrast (APPLIED)
Comparison: None.

CLINICAL DATA: Abdominal pain, vomiting

EXAM:
CT ABDOMEN AND PELVIS WITH CONTRAST
TECHNIQUE: Multidetector CT imaging of the abdomen and pelvis was performed
using the standard protocol following bolus administration of
intravenous contrast.
CONTRAST:  100mL OMNIPAQUE IOHEXOL 300 MG/ML  SOLN

[Series 3: abd/ pelvis 5.0 i30f 2 · axial · 0.68mm/px · z∈[+750,+1140]mm · 14 of 86 slices shown, 16 images]
[im 4/86  soft-tissue]
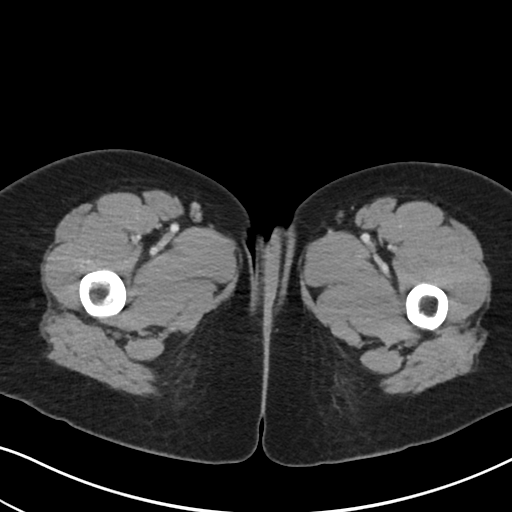
[im 4/86  bone]
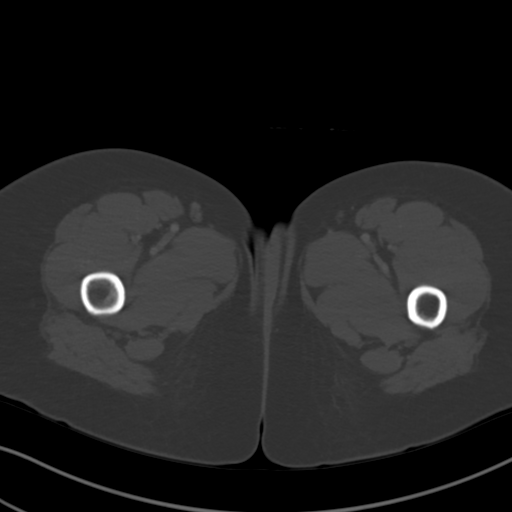
[im 11/86  soft-tissue]
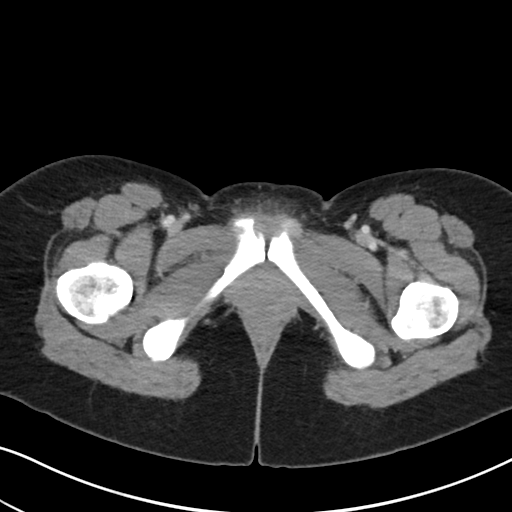
[im 18/86  soft-tissue]
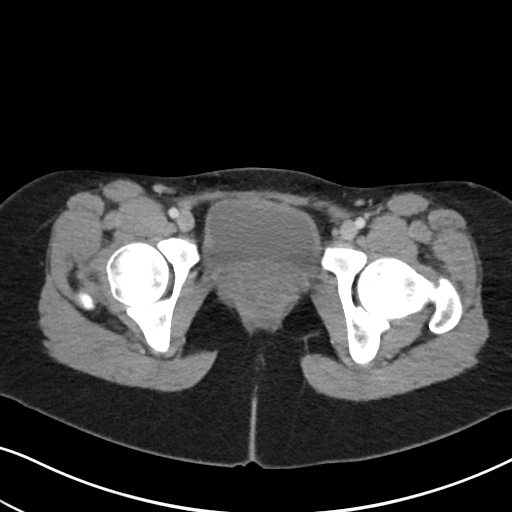
[im 24/86  soft-tissue]
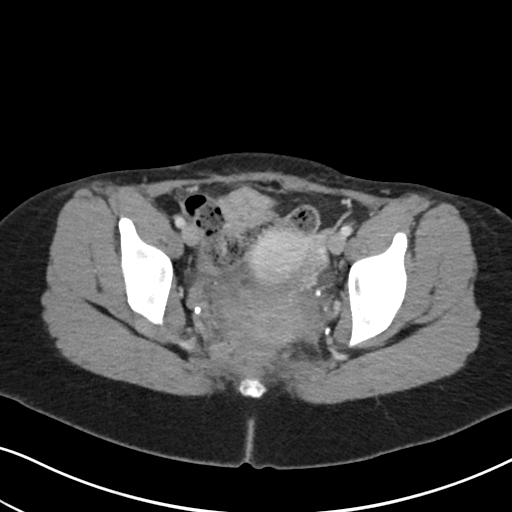
[im 28/86  soft-tissue]
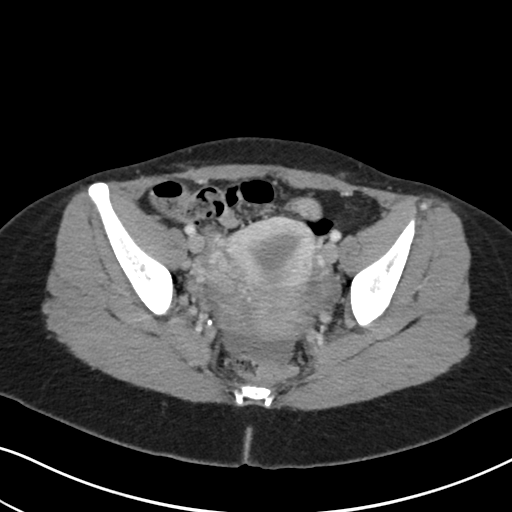
[im 35/86  soft-tissue]
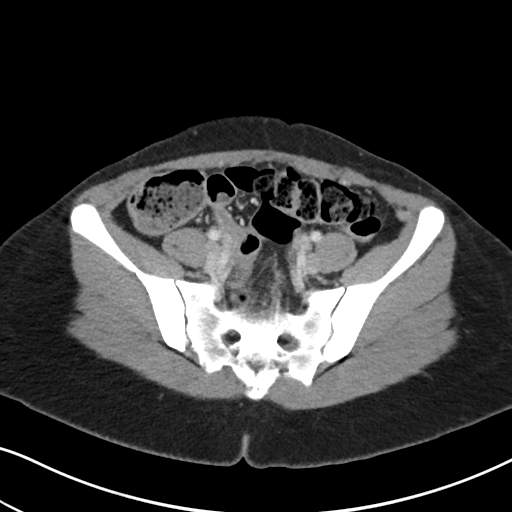
[im 41/86  soft-tissue]
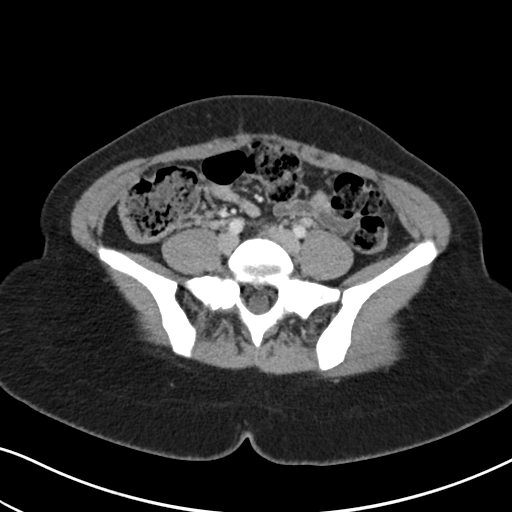
[im 45/86  soft-tissue]
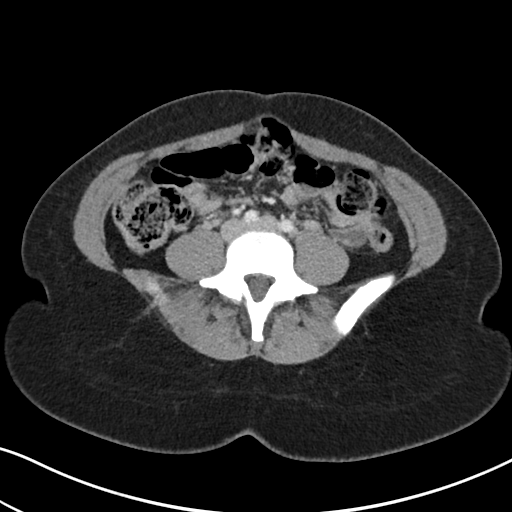
[im 52/86  soft-tissue]
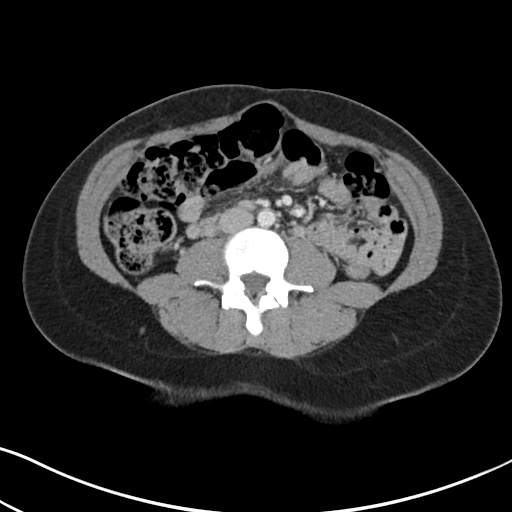
[im 52/86  bone]
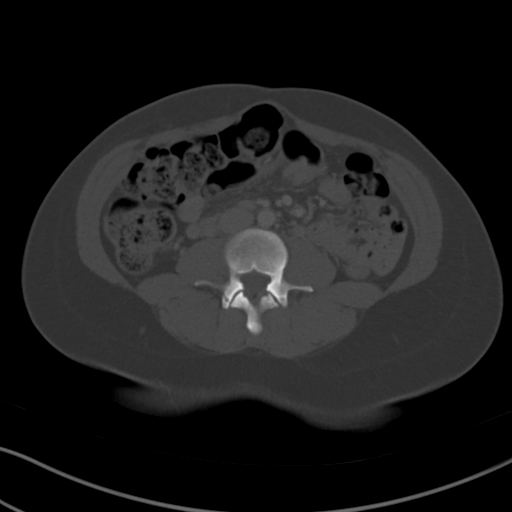
[im 58/86  soft-tissue]
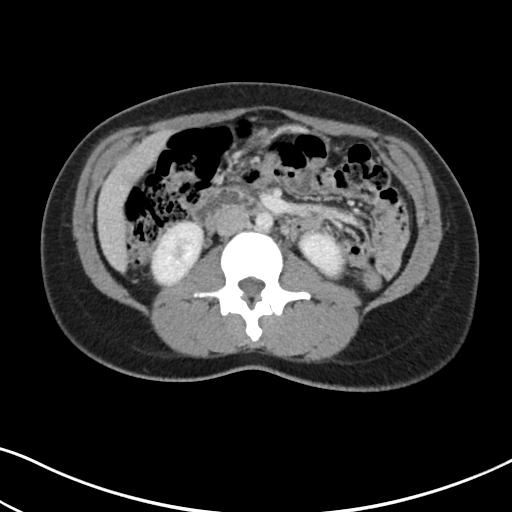
[im 65/86  soft-tissue]
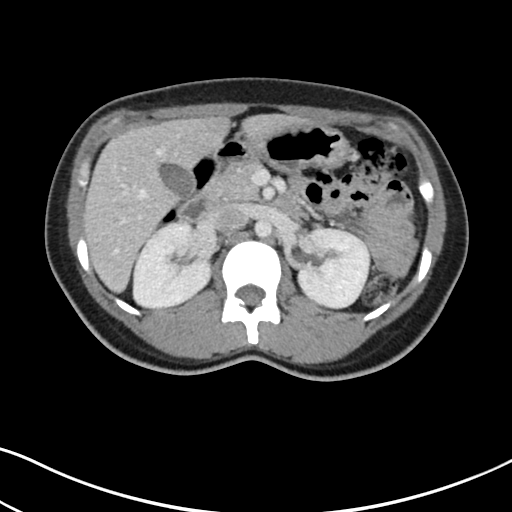
[im 69/86  soft-tissue]
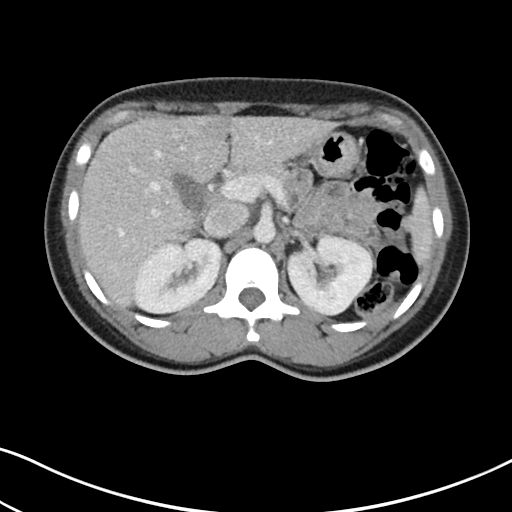
[im 75/86  soft-tissue]
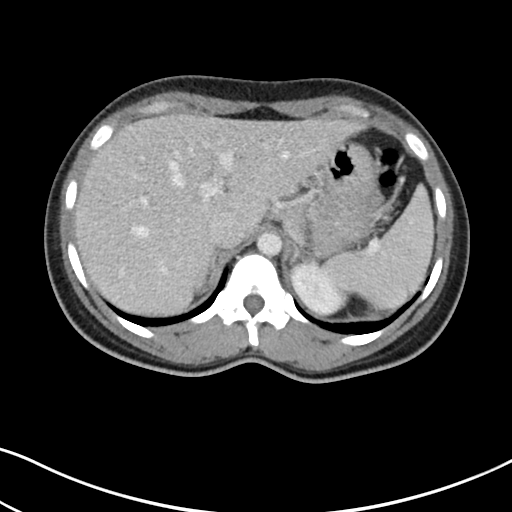
[im 82/86  soft-tissue]
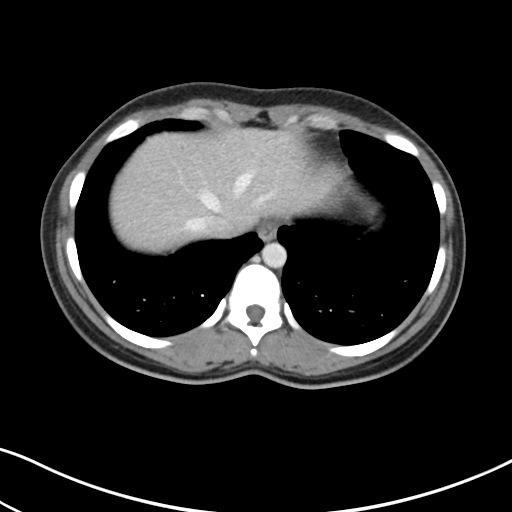

[Series 6: coronal soft tissue · coronal · 0.77mm/px · 3 of 97 slices shown]
[im 33/97  soft-tissue]
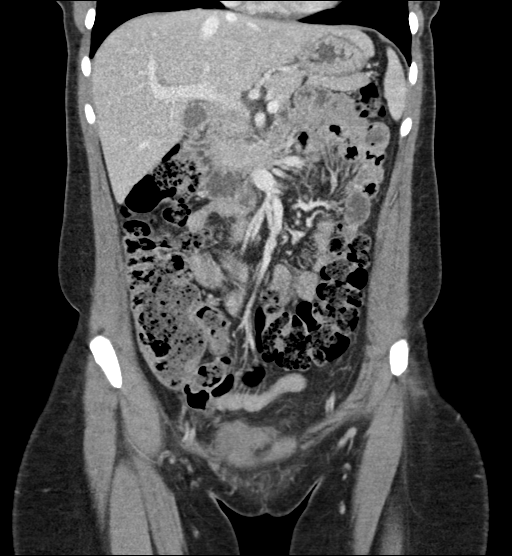
[im 43/97  soft-tissue]
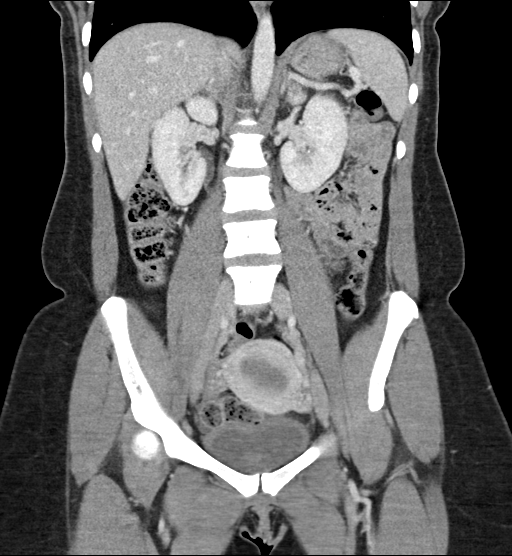
[im 54/97  soft-tissue]
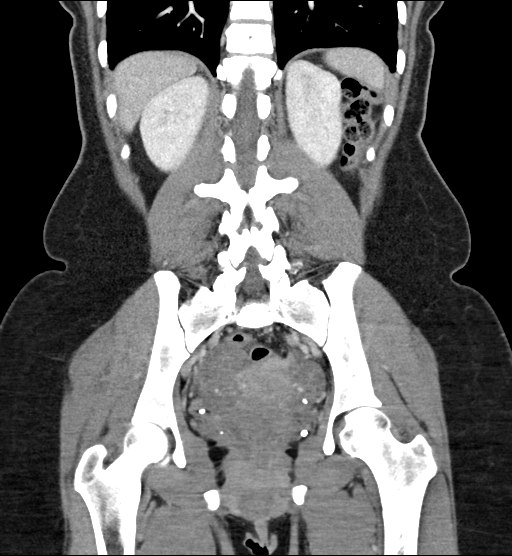

[17 of 46 positions shown; findings below may reference images not displayed]

FINDINGS: Lower chest: Lung bases are clear. No effusions. Heart is normal
size.

Hepatobiliary: Diffuse low-density throughout the liver compatible
with fatty infiltration. No focal abnormality. Gallbladder
unremarkable.

Pancreas: No focal abnormality or ductal dilatation.

Spleen: No focal abnormality.  Normal size.

Adrenals/Urinary Tract: Punctate nonobstructing stone in the lower
pole of the left kidney. No ureteral stones or hydronephrosis.
Urinary bladder and adrenal glands are unremarkable.

Stomach/Bowel: Large stool burden throughout the colon. No evidence
of bowel obstruction. Appendix is mildly prominent, measuring 10 mm,
best seen on coronal image 36. No surrounding inflammation.

Vascular/Lymphatic: No evidence of aneurysm or adenopathy.

Reproductive: Uterus and adnexa unremarkable.  No mass.

Other: Trace free fluid in the pelvis.  No free air.

Musculoskeletal: No acute bony abnormality.
IMPRESSION: Mildly dilated appendix, measuring up to 10 mm without surrounding
inflammation. Recommend clinical correlation to exclude early
appendicitis.

Large stool burden throughout the colon.

Mild fatty infiltration of the liver.

Punctate left lower pole nephrolithiasis.

## 2019-06-20 LAB — NOVEL CORONAVIRUS, NAA: SARS-CoV-2, NAA: NOT DETECTED

## 2019-08-19 ENCOUNTER — Other Ambulatory Visit: Payer: Self-pay

## 2019-08-19 DIAGNOSIS — Z20822 Contact with and (suspected) exposure to covid-19: Secondary | ICD-10-CM

## 2019-08-21 LAB — NOVEL CORONAVIRUS, NAA: SARS-CoV-2, NAA: NOT DETECTED

## 2019-11-25 ENCOUNTER — Ambulatory Visit: Payer: No Typology Code available for payment source | Attending: Internal Medicine

## 2019-11-25 ENCOUNTER — Other Ambulatory Visit: Payer: Self-pay | Admitting: Cardiology

## 2019-11-25 ENCOUNTER — Inpatient Hospital Stay (HOSPITAL_COMMUNITY): Admission: RE | Admit: 2019-11-25 | Payer: Self-pay | Source: Ambulatory Visit

## 2019-11-25 DIAGNOSIS — Z20822 Contact with and (suspected) exposure to covid-19: Secondary | ICD-10-CM

## 2019-11-25 DIAGNOSIS — Z119 Encounter for screening for infectious and parasitic diseases, unspecified: Secondary | ICD-10-CM

## 2019-11-26 LAB — NOVEL CORONAVIRUS, NAA: SARS-CoV-2, NAA: DETECTED — AB

## 2019-11-26 NOTE — Progress Notes (Signed)
Your test for COVID-19 was positive ("detected"), meaning that you were infected with the novel coronavirus and could give the germ to others.    Please continue isolation at home, for at least 10 days since the start of your fever/cough/breathlessness and until you have had 24 hours without fever (without taking a fever reducer) and with any cough/breathlessness improving. Use over-the-counter medications for symptoms.  If you have had no symptoms, but were exposed to someone who was positive for COVID-19, you will need to quarantine and self-isolate for 14 days from the date of exposure.    Please continue good preventive care measures, including:  frequent hand-washing, avoid touching your face, cover coughs/sneezes, stay out of crowds and keep a 6 foot distance from others.  Clean hard surfaces touched frequently with disinfectant cleaning products.   Please check in with your primary care provider about your positive test result.  Go to the nearest urgent care or ED for assessment if you have severe breathlessness or severe weakness/fatigue (ex needing new help getting out of bed or to the bathroom).  Members of your household will also need to quarantine for 14 days from the date of your positive test. You may be contacted to discuss possible treatment options, and you may also be contacted by the health department for follow up. Please call [place] at [phone] if you have any questions or concerns.     

## 2020-01-07 DIAGNOSIS — J320 Chronic maxillary sinusitis: Secondary | ICD-10-CM | POA: Insufficient documentation

## 2020-03-03 ENCOUNTER — Other Ambulatory Visit (HOSPITAL_COMMUNITY)
Admission: RE | Admit: 2020-03-03 | Discharge: 2020-03-03 | Disposition: A | Discharge status: Home / Self Care | Payer: No Typology Code available for payment source | Source: Ambulatory Visit | Attending: Otolaryngology | Admitting: Otolaryngology

## 2020-03-03 ENCOUNTER — Other Ambulatory Visit: Payer: Self-pay

## 2020-03-03 ENCOUNTER — Encounter (HOSPITAL_BASED_OUTPATIENT_CLINIC_OR_DEPARTMENT_OTHER): Payer: Self-pay | Admitting: Otolaryngology

## 2020-03-03 DIAGNOSIS — Z20822 Contact with and (suspected) exposure to covid-19: Secondary | ICD-10-CM | POA: Diagnosis not present

## 2020-03-03 DIAGNOSIS — Z01812 Encounter for preprocedural laboratory examination: Secondary | ICD-10-CM | POA: Diagnosis not present

## 2020-03-03 LAB — SARS CORONAVIRUS 2 (TAT 6-24 HRS): SARS Coronavirus 2: NEGATIVE

## 2020-03-03 NOTE — Progress Notes (Signed)
LVM for pt to return to the covid drive- thru for testing for her procedure on Fri 5/28. Pt sts she tested + for covid in April, but it was actually 11/25/19 (Epic). Therefore, the pt needs to return before Fri for testing.

## 2020-03-04 ENCOUNTER — Other Ambulatory Visit: Payer: Self-pay | Admitting: Otolaryngology

## 2020-03-06 ENCOUNTER — Ambulatory Visit (HOSPITAL_BASED_OUTPATIENT_CLINIC_OR_DEPARTMENT_OTHER): Payer: No Typology Code available for payment source | Admitting: Anesthesiology

## 2020-03-06 ENCOUNTER — Other Ambulatory Visit: Payer: Self-pay

## 2020-03-06 ENCOUNTER — Ambulatory Visit (HOSPITAL_BASED_OUTPATIENT_CLINIC_OR_DEPARTMENT_OTHER)
Admission: RE | Admit: 2020-03-06 | Discharge: 2020-03-06 | Disposition: A | Discharge status: Home / Self Care | Payer: No Typology Code available for payment source | Attending: Otolaryngology | Admitting: Otolaryngology

## 2020-03-06 ENCOUNTER — Encounter (HOSPITAL_BASED_OUTPATIENT_CLINIC_OR_DEPARTMENT_OTHER): Payer: Self-pay | Admitting: Otolaryngology

## 2020-03-06 ENCOUNTER — Encounter (HOSPITAL_BASED_OUTPATIENT_CLINIC_OR_DEPARTMENT_OTHER)
Admission: RE | Disposition: A | Discharge status: Home / Self Care | Payer: Self-pay | Source: Home / Self Care | Attending: Otolaryngology

## 2020-03-06 DIAGNOSIS — J320 Chronic maxillary sinusitis: Secondary | ICD-10-CM | POA: Diagnosis not present

## 2020-03-06 DIAGNOSIS — J339 Nasal polyp, unspecified: Secondary | ICD-10-CM | POA: Diagnosis not present

## 2020-03-06 DIAGNOSIS — Z885 Allergy status to narcotic agent status: Secondary | ICD-10-CM | POA: Insufficient documentation

## 2020-03-06 HISTORY — PX: SINUS ENDO WITH FUSION: SHX5329

## 2020-03-06 LAB — POCT PREGNANCY, URINE: Preg Test, Ur: NEGATIVE

## 2020-03-06 SURGERY — SURGERY, PARANASAL SINUS, ENDOSCOPIC, WITH NASAL SEPTOPLASTY, TURBINOPLASTY, AND MAXILLARY SINUSOTOMY
Anesthesia: General | Site: Nose | Laterality: Right

## 2020-03-06 MED ORDER — EPHEDRINE SULFATE 50 MG/ML IJ SOLN
INTRAMUSCULAR | Status: DC | PRN
  Administered 2020-03-06: 10 mg via INTRAVENOUS

## 2020-03-06 MED ORDER — FENTANYL CITRATE (PF) 100 MCG/2ML IJ SOLN
INTRAMUSCULAR | Status: AC
  Filled 2020-03-06: qty 2

## 2020-03-06 MED ORDER — PROMETHAZINE HCL 25 MG/ML IJ SOLN
INTRAMUSCULAR | Status: AC
  Filled 2020-03-06: qty 1

## 2020-03-06 MED ORDER — MIDAZOLAM HCL 5 MG/5ML IJ SOLN
INTRAMUSCULAR | Status: DC | PRN
  Administered 2020-03-06: 2 mg via INTRAVENOUS

## 2020-03-06 MED ORDER — GLYCOPYRROLATE PF 0.2 MG/ML IJ SOSY
PREFILLED_SYRINGE | INTRAMUSCULAR | Status: AC
  Filled 2020-03-06: qty 1

## 2020-03-06 MED ORDER — OXYCODONE HCL 5 MG/5ML PO SOLN: 5.0000 mg | Freq: Once | ORAL | Status: DC | PRN

## 2020-03-06 MED ORDER — ONDANSETRON HCL 4 MG/2ML IJ SOLN
INTRAMUSCULAR | Status: AC
  Filled 2020-03-06: qty 2

## 2020-03-06 MED ORDER — PROPOFOL 10 MG/ML IV BOLUS
INTRAVENOUS | Status: AC
  Filled 2020-03-06: qty 20

## 2020-03-06 MED ORDER — SUCCINYLCHOLINE CHLORIDE 20 MG/ML IJ SOLN
INTRAMUSCULAR | Status: DC | PRN
  Administered 2020-03-06: 50 mg via INTRAVENOUS

## 2020-03-06 MED ORDER — OXYCODONE HCL 5 MG PO TABS: 5.0000 mg | ORAL_TABLET | Freq: Once | ORAL | Status: DC | PRN

## 2020-03-06 MED ORDER — PROMETHAZINE HCL 25 MG/ML IJ SOLN
6.2500 mg | INTRAMUSCULAR | Status: DC | PRN
  Administered 2020-03-06: 6.25 mg via INTRAVENOUS

## 2020-03-06 MED ORDER — TRIAMCINOLONE ACETONIDE 40 MG/ML IJ SUSP
INTRAMUSCULAR | Status: AC
  Filled 2020-03-06: qty 5

## 2020-03-06 MED ORDER — PROPOFOL 10 MG/ML IV BOLUS
INTRAVENOUS | Status: DC | PRN
  Administered 2020-03-06: 200 mg via INTRAVENOUS

## 2020-03-06 MED ORDER — MIDAZOLAM HCL 2 MG/2ML IJ SOLN
INTRAMUSCULAR | Status: AC
  Filled 2020-03-06: qty 2

## 2020-03-06 MED ORDER — LIDOCAINE 2% (20 MG/ML) 5 ML SYRINGE
INTRAMUSCULAR | Status: DC | PRN
  Administered 2020-03-06: 50 mg via INTRAVENOUS

## 2020-03-06 MED ORDER — MUPIROCIN 2 % EX OINT
TOPICAL_OINTMENT | CUTANEOUS | Status: DC | PRN
  Administered 2020-03-06: 1 via NASAL

## 2020-03-06 MED ORDER — SODIUM CHLORIDE 0.9 % IV SOLN
INTRAVENOUS | Status: AC | PRN
  Administered 2020-03-06: 150 mL

## 2020-03-06 MED ORDER — DEXAMETHASONE SODIUM PHOSPHATE 4 MG/ML IJ SOLN
INTRAMUSCULAR | Status: DC | PRN
  Administered 2020-03-06 (×2): 10 mg via INTRAVENOUS

## 2020-03-06 MED ORDER — MUPIROCIN 2 % EX OINT
TOPICAL_OINTMENT | CUTANEOUS | Status: AC
  Filled 2020-03-06: qty 22

## 2020-03-06 MED ORDER — LIDOCAINE-EPINEPHRINE 1 %-1:100000 IJ SOLN
INTRAMUSCULAR | Status: DC | PRN
  Administered 2020-03-06: 4 mL

## 2020-03-06 MED ORDER — OXYMETAZOLINE HCL 0.05 % NA SOLN
NASAL | Status: DC | PRN
  Administered 2020-03-06: 1 via TOPICAL

## 2020-03-06 MED ORDER — FENTANYL CITRATE (PF) 100 MCG/2ML IJ SOLN
INTRAMUSCULAR | Status: DC | PRN
  Administered 2020-03-06: 25 ug via INTRAVENOUS
  Administered 2020-03-06: 100 ug via INTRAVENOUS
  Administered 2020-03-06 (×5): 25 ug via INTRAVENOUS

## 2020-03-06 MED ORDER — DEXAMETHASONE SODIUM PHOSPHATE 10 MG/ML IJ SOLN
INTRAMUSCULAR | Status: AC
  Filled 2020-03-06: qty 1

## 2020-03-06 MED ORDER — ONDANSETRON HCL 4 MG/2ML IJ SOLN
INTRAMUSCULAR | Status: DC | PRN
  Administered 2020-03-06: 4 mg via INTRAVENOUS

## 2020-03-06 MED ORDER — NALOXONE HCL 0.4 MG/ML IJ SOLN
INTRAMUSCULAR | Status: AC
  Filled 2020-03-06: qty 1

## 2020-03-06 MED ORDER — ESMOLOL HCL 100 MG/10ML IV SOLN
INTRAVENOUS | Status: AC
  Filled 2020-03-06: qty 10

## 2020-03-06 MED ORDER — PHENYLEPHRINE HCL (PRESSORS) 10 MG/ML IV SOLN
INTRAVENOUS | Status: DC | PRN
  Administered 2020-03-06: 80 ug via INTRAVENOUS

## 2020-03-06 MED ORDER — BACITRACIN ZINC 500 UNIT/GM EX OINT
TOPICAL_OINTMENT | CUTANEOUS | Status: AC
  Filled 2020-03-06: qty 28.35

## 2020-03-06 MED ORDER — SUCCINYLCHOLINE CHLORIDE 200 MG/10ML IV SOSY
PREFILLED_SYRINGE | INTRAVENOUS | Status: AC
  Filled 2020-03-06: qty 10

## 2020-03-06 MED ORDER — MUPIROCIN CALCIUM 2 % EX CREA
TOPICAL_CREAM | CUTANEOUS | Status: AC
  Filled 2020-03-06: qty 15

## 2020-03-06 MED ORDER — CEFAZOLIN SODIUM-DEXTROSE 2-3 GM-%(50ML) IV SOLR
INTRAVENOUS | Status: DC | PRN
  Administered 2020-03-06: 2 g via INTRAVENOUS

## 2020-03-06 MED ORDER — TRIAMCINOLONE ACETONIDE 40 MG/ML IJ SUSP
INTRAMUSCULAR | Status: DC | PRN
  Administered 2020-03-06: 200 mg

## 2020-03-06 MED ORDER — OXYMETAZOLINE HCL 0.05 % NA SOLN
NASAL | Status: AC
  Filled 2020-03-06: qty 30

## 2020-03-06 MED ORDER — LACTATED RINGERS IV SOLN: INTRAVENOUS | Status: DC

## 2020-03-06 MED ORDER — LIDOCAINE-EPINEPHRINE 1 %-1:100000 IJ SOLN
INTRAMUSCULAR | Status: AC
  Filled 2020-03-06: qty 1

## 2020-03-06 MED ORDER — LIDOCAINE 2% (20 MG/ML) 5 ML SYRINGE
INTRAMUSCULAR | Status: AC
  Filled 2020-03-06: qty 5

## 2020-03-06 MED ORDER — CEFAZOLIN SODIUM-DEXTROSE 2-4 GM/100ML-% IV SOLN
INTRAVENOUS | Status: AC
  Filled 2020-03-06: qty 100

## 2020-03-06 MED ORDER — FENTANYL CITRATE (PF) 100 MCG/2ML IJ SOLN: 25.0000 ug | INTRAMUSCULAR | Status: DC | PRN

## 2020-03-06 MED ORDER — CEPHALEXIN 500 MG PO CAPS: 500.0000 mg | ORAL_CAPSULE | Freq: Three times a day (TID) | ORAL | 0 refills | Status: AC

## 2020-03-06 SURGICAL SUPPLY — 56 items
BLADE RAD40 ROTATE 4M 4 5PK (BLADE) IMPLANT
BLADE RAD60 ROTATE M4 4 5PK (BLADE) IMPLANT
BLADE ROTATE RAD 12 4 M4 (BLADE) IMPLANT
BLADE ROTATE RAD 40 4 M4 (BLADE) ×1 IMPLANT
BLADE ROTATE TRICUT 4X13 M4 (BLADE) ×2 IMPLANT
BLADE TRICUT ROTATE M4 4 5PK (BLADE) IMPLANT
BUR HS RAD FRONTAL 3 (BURR) IMPLANT
CANISTER SUC SOCK COL 7IN (MISCELLANEOUS) ×2 IMPLANT
CANISTER SUCT 1200ML W/VALVE (MISCELLANEOUS) ×4 IMPLANT
COAGULATOR SUCT SWTCH 10FR 6 (ELECTROSURGICAL) IMPLANT
COVER WAND RF STERILE (DRAPES) IMPLANT
DECANTER SPIKE VIAL GLASS SM (MISCELLANEOUS) IMPLANT
DRAPE SURG 17X23 STRL (DRAPES) ×2 IMPLANT
DRESSING NASAL KENNEDY 3.5X.9 (MISCELLANEOUS) IMPLANT
DRSG NASAL KENNEDY 3.5X.9 (MISCELLANEOUS)
DRSG NASOPORE 8CM (GAUZE/BANDAGES/DRESSINGS) ×1 IMPLANT
DRSG TELFA 3X8 NADH (GAUZE/BANDAGES/DRESSINGS) IMPLANT
ELECT COATED BLADE 2.86 ST (ELECTRODE) IMPLANT
ELECT REM PT RETURN 9FT ADLT (ELECTROSURGICAL)
ELECTRODE REM PT RTRN 9FT ADLT (ELECTROSURGICAL) IMPLANT
GLOVE BIOGEL M 7.0 STRL (GLOVE) ×4 IMPLANT
GLOVE BIOGEL PI IND STRL 7.0 (GLOVE) IMPLANT
GLOVE BIOGEL PI INDICATOR 7.0 (GLOVE) ×2
GLOVE SURG SS PI 7.0 STRL IVOR (GLOVE) ×2 IMPLANT
GOWN STRL REUS W/ TWL LRG LVL3 (GOWN DISPOSABLE) ×2 IMPLANT
GOWN STRL REUS W/TWL LRG LVL3 (GOWN DISPOSABLE) ×6
IV NS 1000ML (IV SOLUTION)
IV NS 1000ML BAXH (IV SOLUTION) IMPLANT
IV NS 500ML (IV SOLUTION) ×2
IV NS 500ML BAXH (IV SOLUTION) ×1 IMPLANT
IV SET EXT 30 76VOL 4 MALE LL (IV SETS) ×1 IMPLANT
NDL PRECISIONGLIDE 27X1.5 (NEEDLE) ×1 IMPLANT
NEEDLE PRECISIONGLIDE 27X1.5 (NEEDLE) ×2 IMPLANT
NS IRRIG 1000ML POUR BTL (IV SOLUTION) ×1 IMPLANT
PACK ENT DAY SURGERY (CUSTOM PROCEDURE TRAY) ×2 IMPLANT
PAD DRESSING TELFA 3X8 NADH (GAUZE/BANDAGES/DRESSINGS) IMPLANT
PENCIL SMOKE EVACUATOR (MISCELLANEOUS) IMPLANT
SET BASIN DAY SURGERY F.S. (CUSTOM PROCEDURE TRAY) ×2 IMPLANT
SLEEVE SCD COMPRESS KNEE MED (MISCELLANEOUS) ×1 IMPLANT
SOLUTION BUTLER CLEAR DIP (MISCELLANEOUS) ×2 IMPLANT
SPLINT NASAL AIRWAY SILICONE (MISCELLANEOUS) IMPLANT
SPONGE GAUZE 2X2 8PLY STRL LF (GAUZE/BANDAGES/DRESSINGS) ×2 IMPLANT
SPONGE NEURO XRAY DETECT 1X3 (DISPOSABLE) ×2 IMPLANT
SUT ETHILON 3 0 PS 1 (SUTURE) IMPLANT
SUT PLAIN 4 0 ~~LOC~~ 1 (SUTURE) IMPLANT
SUT SILK 3 0 PS 1 (SUTURE) IMPLANT
SYR 3ML 23GX1 SAFETY (SYRINGE) IMPLANT
SYR CONTROL 10ML LL (SYRINGE) ×1 IMPLANT
TOWEL GREEN STERILE FF (TOWEL DISPOSABLE) ×4 IMPLANT
TRACKER ENT INSTRUMENT (MISCELLANEOUS) ×2 IMPLANT
TRACKER ENT PATIENT (MISCELLANEOUS) ×2 IMPLANT
TUBE CONNECTING 20X1/4 (TUBING) ×2 IMPLANT
TUBE SALEM SUMP 12R W/ARV (TUBING) IMPLANT
TUBE SALEM SUMP 16 FR W/ARV (TUBING) ×1 IMPLANT
TUBING STRAIGHTSHOT EPS 5PK (TUBING) ×2 IMPLANT
YANKAUER SUCT BULB TIP NO VENT (SUCTIONS) ×2 IMPLANT

## 2020-03-06 NOTE — Discharge Instructions (Signed)

## 2020-03-06 NOTE — Anesthesia Preprocedure Evaluation (Addendum)
Anesthesia Evaluation  Patient identified by MRN, date of birth, ID band Patient awake    Reviewed: Allergy & Precautions, NPO status , Patient's Chart, lab work & pertinent test results  History of Anesthesia Complications Negative for: history of anesthetic complications  Airway Mallampati: I  TM Distance: >3 FB Neck ROM: Full    Dental  (+) Dental Advisory Given, Chipped,    Pulmonary neg pulmonary ROS,    Pulmonary exam normal        Cardiovascular negative cardio ROS Normal cardiovascular exam     Neuro/Psych negative neurological ROS  negative psych ROS   GI/Hepatic negative GI ROS, Neg liver ROS,   Endo/Other  negative endocrine ROS  Renal/GU negative Renal ROS     Musculoskeletal negative musculoskeletal ROS (+)   Abdominal   Peds  Hematology negative hematology ROS (+)   Anesthesia Other Findings Covid neg 5/25  Reproductive/Obstetrics                            Anesthesia Physical Anesthesia Plan  ASA: I  Anesthesia Plan: General   Post-op Pain Management:    Induction: Intravenous  PONV Risk Score and Plan: 3 and Treatment may vary due to age or medical condition, Ondansetron, Midazolam and Dexamethasone  Airway Management Planned: Oral ETT  Additional Equipment: None  Intra-op Plan:   Post-operative Plan: Extubation in OR  Informed Consent: I have reviewed the patients History and Physical, chart, labs and discussed the procedure including the risks, benefits and alternatives for the proposed anesthesia with the patient or authorized representative who has indicated his/her understanding and acceptance.     Dental advisory given  Plan Discussed with: CRNA and Anesthesiologist  Anesthesia Plan Comments:        Anesthesia Quick Evaluation

## 2020-03-06 NOTE — Op Note (Signed)
Operative Note:  Right ENDOSCOPIC SINUS SURGERY WITH NAVIGATION      Patient: Lori Jackson  Medical record number: [id number]  Date:03/06/2020  Pre-operative Indications: 1.  Chronic Right sinusitis        Postoperative Indications: Same  Surgical Procedure: 1.  Right endoscopic sinus surgery with intraoperative computer-assisted navigation (fusion) consisting of: Right total ethmoidectomy, right maxillary antrostomy with removal of diseased tissue and right nasal frontal recess exploration.      Anesthesia: GET  Surgeon: Barbee Cough, M.D.  Complications: None  EBL: 100 cc  Findings: The patient had extensive polypoid disease involving the ethmoid, maxillary and frontal sinuses with complete opacification and thick mucopurulent material in all sinuses.  Culture and sensitivity were obtained.  Tissue sent for pathologic diagnosis.  Kenalog slurry placed in the sinuses at the conclusion of the procedure.  Nasa-pore nasal packing placed in the right ethmoid sinus.   Brief History: The patient is a 29 y.o. female with a history of chronic sinusitis and turbinate hypertrophy. The patient has been on medical therapy to reduce nasal mucosal edema and infection including antibiotics, saline nasal spray and topical nasal steroids. Despite appropriate medical therapy the patient continues to have ongoing symptoms. Given the patient's history and findings, the above surgical procedures were recommended, risks and benefits were discussed in detail with the patient may understand and agree with our plan for surgery which is scheduled at [facility] under general anesthesia as an outpatient.  Surgical Procedure: The patient is brought to the operating room on 03/06/2020 and placed in supine position on the operating table. General endotracheal anesthesia was established without difficulty. When the patient was adequately anesthetized, surgical timeout was performed with correct  identification of the patient and the surgical procedure. The patient's nose was then injected with 5 cc of 1% lidocaine 1:100,000 dilution epinephrine which was injected in a submucosal fashion. The patient's nose was then packed with Afrin-soaked cottonoid pledgets were left in place for approximately 10 minutes to allow for vasoconstriction and hemostasis.  The Xomed Fusion navigation headgear was applied in anatomic and surgical landmarks were identified and confirmed, navigation was used throughout the sinus component of the surgical procedure.  With the patient prepped draped and prepared for surgery, nasal nasal endoscopy was performed on the right side.  The patient had extensive polypoid disease involving the middle meatus and obstructing the ostiomeatal complex.  There is thick mucopurulent material emanating from the maxillary sinus.  Culture and sensitivity were obtained for aerobic and anaerobic bacteria.  Using a 0 degree endoscope and a straight microdebrider, a total ethmoidectomy was performed dissecting from anterior to posterior along the floor of the ethmoid sinus removing bony septations and diseased mucosa.  Using a 45 degree telescope and a curved microdebrider dissection was then carried out along the roof of the ethmoid sinus with navigation, completing a total ethmoidectomy.  Attention was then turned to the nasal frontal recess and again using a curved microdebrider, navigation and endoscopic visualization the nasal frontal recess was widely open, underlying ethmoid cells and diseased mucosa resected creating a widely patent nasal frontal recess.  The lateral nasal wall was inspected, uncinate process was resected using a through-cutting forcep and the natural ostium of the maxillary sinus was enlarged in a posterior and inferior direction.  Within the maxillary sinus thick mucoid material and polypoid soft tissue was resected.  Extensive polypoid mucosa was debrided from the right  maxillary sinus and the ostia was left widely  patent at the conclusion of the procedure..  At the conclusion of the procedure, a 50-50 mix of Kenalog 40 and Bactroban was instilled in the sinuses.  Nasa-pore nasal packing was placed in the common ethmoid cavity bilaterally.  Surgical sponge count was correct. An oral gastric tube was passed and the stomach contents were aspirated. Patient was awakened from anesthetic and transferred from the operating room to the recovery room in stable condition. There were no complications and blood loss was 100 cc.   Delsa Bern, M.D. [facility] 03/06/2020

## 2020-03-06 NOTE — Anesthesia Procedure Notes (Signed)
Procedure Name: Intubation Date/Time: 03/06/2020 11:11 AM Performed by: Marrianne Mood, CRNA Pre-anesthesia Checklist: Patient identified, Emergency Drugs available, Suction available, Patient being monitored and Timeout performed Patient Re-evaluated:Patient Re-evaluated prior to induction Oxygen Delivery Method: Circle system utilized Preoxygenation: Pre-oxygenation with 100% oxygen Induction Type: IV induction Ventilation: Mask ventilation without difficulty Laryngoscope Size: Mac and 3 Grade View: Grade II Tube type: Oral Tube size: 7.0 mm Number of attempts: 1 Airway Equipment and Method: Stylet and Oral airway Placement Confirmation: ETT inserted through vocal cords under direct vision,  positive ETCO2 and breath sounds checked- equal and bilateral Secured at: 22 cm Tube secured with: Tape Dental Injury: Teeth and Oropharynx as per pre-operative assessment

## 2020-03-06 NOTE — H&P (Signed)
Lori Jackson is an 29 y.o. female.   Chief Complaint: Right nasal polyposis HPI: Chronic hx of nasal obstruction and congestion  Past Medical History:  Diagnosis Date  . No pertinent past medical history     Past Surgical History:  Procedure Laterality Date  . CESAREAN SECTION    . CESAREAN SECTION  08/14/2012   Procedure: CESAREAN SECTION;  Surgeon: Kathreen Cosier, MD;  Location: [facility];  Service: Obstetrics;  Laterality: N/A;  . LAPAROSCOPIC APPENDECTOMY N/A 02/25/2019   Procedure: APPENDECTOMY LAPAROSCOPIC;  Surgeon: Harriette Bouillon, MD;  Location: [facility];  Service: General;  Laterality: N/A;  . WISDOM TOOTH EXTRACTION      Family History  Problem Relation Age of Onset  . Diabetes Paternal Grandmother   . Hypertension Paternal Grandmother    Social History:  reports that she has never smoked. She has never used smokeless tobacco. She reports that she does not drink alcohol or use drugs.  Allergies:  Allergies  Allergen Reactions  . Codeine Rash    Pt can take Vicodin    Medications Prior to Admission  Medication Sig Dispense Refill  . acetaminophen (TYLENOL) 500 MG tablet Take 2 tablets (1,000 mg total) by mouth every 8 (eight) hours as needed for mild pain.    Marland Kitchen ibuprofen (ADVIL) 600 MG tablet Take 1 tablet (600 mg total) by mouth every 6 (six) hours as needed (for mild pain not relieved by other medications.).    Marland Kitchen Multiple Vitamins-Calcium (ONE-A-DAY WOMENS PO) Take 1 tablet by mouth daily.      Results for orders placed or performed during the hospital encounter of 03/06/20 (from the past 48 hour(s))  Pregnancy, urine POC     Status: None   Collection Time: 03/06/20  9:03 AM  Result Value Ref Range   Preg Test, Ur NEGATIVE NEGATIVE    Comment:        THE SENSITIVITY OF THIS METHODOLOGY IS >24 mIU/mL    No results found.  Review of Systems  Constitutional: Negative.   HENT: Positive for congestion, postnasal drip and sinus pressure.   Respiratory:  Negative.   Cardiovascular: Negative.     Blood pressure 109/66, pulse 89, temperature 98.3 F (36.8 C), temperature source Oral, resp. rate 16, height 5\' 5"  (1.651 m), weight 68.1 kg, last menstrual period 03/06/2020, SpO2 100 %. Physical Exam  Constitutional: She appears well-developed.  HENT:  Right sinus polyps  Musculoskeletal:     Cervical back: Normal range of motion and neck supple.     Assessment/Plan Adm for OP Right Ess  03/08/2020, MD 03/06/2020, 10:59 AM

## 2020-03-06 NOTE — Anesthesia Postprocedure Evaluation (Signed)
Anesthesia Post Note  Patient: ANNEBELLE BOSTIC  Procedure(s) Performed: RIGHT SINUS ENDO WITH FUSION (Right Nose)     Patient location during evaluation: PACU Anesthesia Type: General Level of consciousness: awake and alert Pain management: pain level controlled Vital Signs Assessment: post-procedure vital signs reviewed and stable Respiratory status: spontaneous breathing, nonlabored ventilation and respiratory function stable Cardiovascular status: blood pressure returned to baseline and stable Postop Assessment: no apparent nausea or vomiting Anesthetic complications: no    Last Vitals:  Vitals:   03/06/20 1315 03/06/20 1345  BP: 106/75 109/70  Pulse: 85 95  Resp: 10   Temp:    SpO2: 99% 100%    Last Pain:  Vitals:   03/06/20 1315  TempSrc:   PainSc: 0-No pain                 Beryle Lathe

## 2020-03-06 NOTE — Transfer of Care (Signed)
Immediate Anesthesia Transfer of Care Note  Patient: Lori Jackson  Procedure(s) Performed: RIGHT SINUS ENDO WITH FUSION (Right Nose)  Patient Location: PACU  Anesthesia Type:General  Level of Consciousness: awake, oriented and patient cooperative  Airway & Oxygen Therapy: Patient Spontanous Breathing and Patient connected to face mask oxygen  Post-op Assessment: Report given to RN and Post -op Vital signs reviewed and stable  Post vital signs: Reviewed and stable  Last Vitals:  Vitals Value Taken Time  BP    Temp    Pulse 101 03/06/20 1243  Resp    SpO2 100 % 03/06/20 1243  Vitals shown include unvalidated device data.  Last Pain:  Vitals:   03/06/20 0912  TempSrc: Oral  PainSc: 0-No pain      Patients Stated Pain Goal: 3 (03/06/20 0912)  Complications: No apparent anesthesia complications

## 2020-03-10 ENCOUNTER — Encounter: Payer: Self-pay | Admitting: *Deleted

## 2020-03-10 LAB — SURGICAL PATHOLOGY

## 2020-03-11 LAB — AEROBIC/ANAEROBIC CULTURE W GRAM STAIN (SURGICAL/DEEP WOUND)

## 2020-04-08 LAB — FUNGUS CULTURE WITH STAIN

## 2020-04-08 LAB — FUNGAL ORGANISM REFLEX

## 2020-04-08 LAB — FUNGUS CULTURE RESULT

## 2020-05-11 ENCOUNTER — Ambulatory Visit: Payer: No Typology Code available for payment source | Attending: Critical Care Medicine

## 2020-05-11 ENCOUNTER — Ambulatory Visit: Payer: Self-pay

## 2020-05-11 DIAGNOSIS — Z23 Encounter for immunization: Secondary | ICD-10-CM

## 2020-05-11 NOTE — Progress Notes (Signed)
   Covid-19 Vaccination Clinic  Name:  Lori Jackson    MRN: [id number] DOB: [birth year]  05/11/2020  [name] was observed post Covid-19 immunization for 15 minutes without incident. She was provided with Vaccine Information Sheet and instruction to access the V-Safe system.   [name] was instructed to call 911 with any severe reactions post vaccine: Marland Kitchen Difficulty breathing  . Swelling of face and throat  . A fast heartbeat  . A bad rash all over body  . Dizziness and weakness   Immunizations Administered    Name Date Dose VIS Date Route   Pfizer COVID-19 Vaccine 05/11/2020 11:20 AM 0.3 mL 12/04/2018 Intramuscular   Manufacturer: [employer], [employer]   Lot: [id number]   NDC: [address]-3

## 2020-06-01 ENCOUNTER — Ambulatory Visit: Payer: No Typology Code available for payment source | Attending: Internal Medicine

## 2020-06-01 DIAGNOSIS — Z23 Encounter for immunization: Secondary | ICD-10-CM

## 2020-06-01 NOTE — Progress Notes (Signed)
   Covid-19 Vaccination Clinic  Name:  Lori Jackson    MRN: [id number] DOB: 14-Jun-1991  06/01/2020  [name] was observed post Covid-19 immunization for 15 minutes without incident. She was provided with Vaccine Information Sheet and instruction to access the V-Safe system.   Ms. Civil was instructed to call 911 with any severe reactions post vaccine: Marland Kitchen Difficulty breathing  . Swelling of face and throat  . A fast heartbeat  . A bad rash all over body  . Dizziness and weakness   Immunizations Administered    Name Date Dose VIS Date Route   Pfizer COVID-19 Vaccine 06/01/2020 11:32 AM 0.3 mL 12/04/2018 Intramuscular   Manufacturer: [employer], [employer]   Lot: [id number]   NDC: [address]-0

## 2020-08-10 ENCOUNTER — Ambulatory Visit: Payer: Self-pay | Admitting: Cardiology

## 2020-08-10 ENCOUNTER — Other Ambulatory Visit: Payer: Self-pay

## 2020-08-10 DIAGNOSIS — R079 Chest pain, unspecified: Secondary | ICD-10-CM

## 2020-08-20 NOTE — Progress Notes (Signed)
ICD-10-CM   1. Chest pain of uncertain etiology  R07.9 EKG 12-Lead    08/10/2020: Normal sinus rhythm, 87 bpm, normal axis, without underlying ischemia or injury pattern.  No prior EKGs available for comparison.    Yates Decamp, MD, [facility] 08/20/2020, 8:50 PM Office: [phone] Pager: [phone]

## 2022-01-19 ENCOUNTER — Ambulatory Visit (INDEPENDENT_AMBULATORY_CARE_PROVIDER_SITE_OTHER): Payer: 59 | Admitting: Physician Assistant

## 2022-01-19 ENCOUNTER — Encounter: Payer: Self-pay | Admitting: Physician Assistant

## 2022-01-19 VITALS — BP 98/64 | HR 82 | Temp 98.3°F | Ht 65.0 in | Wt 152.0 lb

## 2022-01-19 DIAGNOSIS — Z7689 Persons encountering health services in other specified circumstances: Secondary | ICD-10-CM | POA: Diagnosis not present

## 2022-01-19 DIAGNOSIS — N941 Unspecified dyspareunia: Secondary | ICD-10-CM | POA: Diagnosis not present

## 2022-01-19 DIAGNOSIS — J320 Chronic maxillary sinusitis: Secondary | ICD-10-CM

## 2022-01-19 NOTE — Progress Notes (Signed)
? ?New Patient Office Visit ? ?Subjective:  ?Patient ID: Lori Jackson, female    DOB: 1991-03-29  Age: 31 y.o. MRN: [id number] ? ?CC:  ?Chief Complaint  ?Patient presents with  ? New Patient (Initial Visit)  ? ? ?HPI ?Lori Jackson presents to establish care. Patient reports it has been about 3 years since last seen a PCP.  ?Patient has a hx of chronic sinusitis and is s/p right endoscopic sinus surgery performed 03/06/2020. Patient states since her sinus surgery gets nauseous out of nowhere and gum usually helps with the nausea. Symptoms have not worsened or become more frequent. No other symptoms such as vomiting, abdominal pain, headache or dizziness. Before her surgery had nausea related to postnasal drainage which has improved since surgery. Patient does not take any medications or supplements. Also has c/o pain with intercourse which is more significant during ovulation. Periods became irregular after her last pregnancy. States has discussed with her OB/GYN previously and had a transvaginal ultrasound which was normal. Patient has never been a smoker. ? ? ?Past Medical History:  ?Diagnosis Date  ? No pertinent past medical history   ? ? ?Past Surgical History:  ?Procedure Laterality Date  ? CESAREAN SECTION  2012  ? CESAREAN SECTION  08/14/2012  ? Procedure: CESAREAN SECTION;  Surgeon: Frederico Hamman, MD;  Location: [place] [facility];  Service: Obstetrics;  Laterality: N/A;  ? CESAREAN SECTION  2009  ? LAPAROSCOPIC APPENDECTOMY N/A 02/25/2019  ? Procedure: APPENDECTOMY LAPAROSCOPIC;  Surgeon: Erroll Luna, MD;  Location: [place];  Service: General;  Laterality: N/A;  ? SINUS ENDO WITH FUSION Right 03/06/2020  ? Procedure: RIGHT SINUS ENDO WITH FUSION;  Surgeon: Jerrell Belfast, MD;  Location: [place];  Service: ENT;  Laterality: Right;  ? WISDOM TOOTH EXTRACTION    ? ? ?Family History  ?Problem Relation Age of Onset  ? Stroke Maternal Grandmother   ? Diabetes Paternal Grandmother   ?  Hypertension Paternal Grandmother   ? ? ?Social History  ? ?Socioeconomic History  ? Marital status: Married  ?  Spouse name: Railey Hedgpeth  ? Number of children: 3  ? Years of education: Not on file  ? Highest education level: Not on file  ?Occupational History  ? Not on file  ?Tobacco Use  ? Smoking status: Never  ? Smokeless tobacco: Never  ?Substance and Sexual Activity  ? Alcohol use: No  ? Drug use: No  ? Sexual activity: Yes  ?  Birth control/protection: None  ?Other Topics Concern  ? Not on file  ?Social History Narrative  ? Not on file  ? ?Social Determinants of Health  ? ?Financial Resource Strain: Not on file  ?Food Insecurity: Not on file  ?Transportation Needs: Not on file  ?Physical Activity: Not on file  ?Stress: Not on file  ?Social Connections: Not on file  ?Intimate Partner Violence: Not on file  ? ? ?ROS ?Review of Systems ?Review of Systems:  ?A fourteen system review of systems was performed and found to be positive as per HPI. ? ?Objective:  ? ?Today's Vitals: BP 98/64   Pulse 82   Temp 98.3 ?F (36.8 ?C)   Ht 5\' 5"  (1.651 m)   Wt 152 lb (68.9 kg)   LMP 01/08/2022 (Approximate)   SpO2 98%   BMI 25.29 kg/m?  ? ?Physical Exam ?General:  Well Developed, well nourished, appropriate for stated age.  ?Neuro:  Alert and oriented,  extra-ocular muscles intact  ?HEENT:  Normocephalic, atraumatic, neck supple  ?Skin:  no gross rash, warm, pink. ?Cardiac:  RRR, S1 S2 ?Respiratory: CTA B/L  ?Vascular:  Ext warm, no cyanosis apprec.; cap RF less 2 sec. ?Psych:  No HI/SI, judgement and insight good, Euthymic mood. Full Affect. ? ?Assessment & Plan:  ? ?Problem List Items Addressed This Visit   ? ?  ? Respiratory  ? Chronic maxillary sinusitis - Primary  ? ?Other Visit Diagnoses   ? ? Dyspareunia in female      ? Encounter to establish care      ? ?  ? ?Chronic maxillary sinusitis: ?-S/p right endoscopic sinus surgery. Discussed with patient possibly olfactory improvement post surgery and recommend to  monitor symptoms, if they become more frequent or worsen then recommend to follow-up with ENT. ? ?Dyspareunia in female: ?-Will request OB/GYN records for review. ?-Discussed with patient potential etiologies and possibly hormonal related or idiopathic. If symptoms worsen then we can consider repeating imaging studies (transvaginal ultrasound) to evaluate for changes.  ?-Recommend to schedule CPE w/ PAP.  ? ?Outpatient Encounter Medications as of 01/19/2022  ?Medication Sig  ? [DISCONTINUED] acetaminophen (TYLENOL) 500 MG tablet Take 2 tablets (1,000 mg total) by mouth every 8 (eight) hours as needed for mild pain.  ? [DISCONTINUED] ibuprofen (ADVIL) 600 MG tablet Take 1 tablet (600 mg total) by mouth every 6 (six) hours as needed (for mild pain not relieved by other medications.).  ? [DISCONTINUED] Multiple Vitamins-Calcium (ONE-A-DAY WOMENS PO) Take 1 tablet by mouth daily.  ? ?No facility-administered encounter medications on file as of 01/19/2022.  ? ? ?Follow-up: Return for CPE w/ PAP and FBW (40 min appt) in 4-6 weeks .  ? ?Lorrene Reid, PA-C ? ?

## 2022-02-25 ENCOUNTER — Encounter: Payer: 59 | Admitting: Physician Assistant

## 2022-03-03 ENCOUNTER — Other Ambulatory Visit: Payer: 59

## 2022-03-03 DIAGNOSIS — Z0000 Encounter for general adult medical examination without abnormal findings: Secondary | ICD-10-CM

## 2022-03-04 LAB — COMPREHENSIVE METABOLIC PANEL
ALT: 12 IU/L (ref 0–32)
AST: 14 IU/L (ref 0–40)
Albumin/Globulin Ratio: 1.8 (ref 1.2–2.2)
Albumin: 4.4 g/dL (ref 3.8–4.8)
Alkaline Phosphatase: 53 IU/L (ref 44–121)
BUN/Creatinine Ratio: 17 (ref 9–23)
BUN: 12 mg/dL (ref 6–20)
Bilirubin Total: 0.3 mg/dL (ref 0.0–1.2)
CO2: 21 mmol/L (ref 20–29)
Calcium: 9.2 mg/dL (ref 8.7–10.2)
Chloride: 106 mmol/L (ref 96–106)
Creatinine, Ser: 0.72 mg/dL (ref 0.57–1.00)
Globulin, Total: 2.4 g/dL (ref 1.5–4.5)
Glucose: 96 mg/dL (ref 70–99)
Potassium: 4.2 mmol/L (ref 3.5–5.2)
Sodium: 141 mmol/L (ref 134–144)
Total Protein: 6.8 g/dL (ref 6.0–8.5)
eGFR: 115 mL/min/{1.73_m2} (ref >59)

## 2022-03-04 LAB — CBC
Hematocrit: 37.4 % (ref 34.0–46.6)
Hemoglobin: 11.9 g/dL (ref 11.1–15.9)
MCH: 26.9 pg (ref 26.6–33.0)
MCHC: 31.8 g/dL (ref 31.5–35.7)
MCV: 85 fL (ref 79–97)
Platelets: 314 10*3/uL (ref 150–450)
RBC: 4.42 x10E6/uL (ref 3.77–5.28)
RDW: 13.1 % (ref 11.7–15.4)
WBC: 4 10*3/uL (ref 3.4–10.8)

## 2022-03-04 LAB — HEMOGLOBIN A1C
Est. average glucose Bld gHb Est-mCnc: 117 mg/dL
Hgb A1c MFr Bld: 5.7 % — ABNORMAL HIGH (ref 4.8–5.6)

## 2022-03-04 LAB — LIPID PANEL
Chol/HDL Ratio: 2.2 ratio (ref 0.0–4.4)
Cholesterol, Total: 178 mg/dL (ref 100–199)
HDL: 82 mg/dL (ref >39)
LDL Chol Calc (NIH): 87 mg/dL (ref 0–99)
Triglycerides: 46 mg/dL (ref 0–149)
VLDL Cholesterol Cal: 9 mg/dL (ref 5–40)

## 2022-03-04 LAB — TSH: TSH: 1.46 u[IU]/mL (ref 0.450–4.500)

## 2022-03-10 ENCOUNTER — Ambulatory Visit (INDEPENDENT_AMBULATORY_CARE_PROVIDER_SITE_OTHER): Payer: 59 | Admitting: Physician Assistant

## 2022-03-10 ENCOUNTER — Other Ambulatory Visit (HOSPITAL_COMMUNITY)
Admission: RE | Admit: 2022-03-10 | Discharge: 2022-03-10 | Disposition: A | Discharge status: Home / Self Care | Payer: 59 | Source: Ambulatory Visit | Attending: Physician Assistant | Admitting: Physician Assistant

## 2022-03-10 ENCOUNTER — Encounter: Payer: Self-pay | Admitting: Physician Assistant

## 2022-03-10 VITALS — BP 103/68 | HR 88 | Temp 97.7°F | Ht 65.0 in | Wt 153.0 lb

## 2022-03-10 DIAGNOSIS — N898 Other specified noninflammatory disorders of vagina: Secondary | ICD-10-CM

## 2022-03-10 DIAGNOSIS — R8290 Unspecified abnormal findings in urine: Secondary | ICD-10-CM | POA: Diagnosis not present

## 2022-03-10 DIAGNOSIS — N3090 Cystitis, unspecified without hematuria: Secondary | ICD-10-CM | POA: Diagnosis not present

## 2022-03-10 DIAGNOSIS — Z0001 Encounter for general adult medical examination with abnormal findings: Secondary | ICD-10-CM

## 2022-03-10 DIAGNOSIS — Z0000 Encounter for general adult medical examination without abnormal findings: Secondary | ICD-10-CM | POA: Diagnosis present

## 2022-03-10 DIAGNOSIS — R102 Pelvic and perineal pain: Secondary | ICD-10-CM

## 2022-03-10 DIAGNOSIS — Z23 Encounter for immunization: Secondary | ICD-10-CM | POA: Diagnosis not present

## 2022-03-10 DIAGNOSIS — N941 Unspecified dyspareunia: Secondary | ICD-10-CM

## 2022-03-10 LAB — POCT URINALYSIS DIPSTICK
Bilirubin, UA: NEGATIVE
Blood, UA: NEGATIVE
Glucose, UA: NEGATIVE
Ketones, UA: NEGATIVE
Leukocytes, UA: NEGATIVE
Nitrite, UA: POSITIVE
Protein, UA: NEGATIVE
Spec Grav, UA: 1.02 (ref 1.010–1.025)
Urobilinogen, UA: 1 E.U./dL
pH, UA: 7 (ref 5.0–8.0)

## 2022-03-10 MED ORDER — NITROFURANTOIN MONOHYD MACRO 100 MG PO CAPS: 100.0000 mg | ORAL_CAPSULE | Freq: Two times a day (BID) | ORAL | 0 refills | Status: DC

## 2022-03-10 NOTE — Progress Notes (Signed)
Complete physical exam   Patient: Lori Jackson   DOB: 1991/08/15   31 y.o. Female  MRN: [id number] Visit Date: 03/10/2022   Chief Complaint  Patient presents with   Annual Exam   Subjective    Lori Jackson is a 31 y.o. female who presents today for a complete physical exam.  She reports consuming a general diet. The patient does not participate in regular exercise at present. She generally feels fairly well. She does not have additional problems to discuss today.     Past Medical History:  Diagnosis Date   No pertinent past medical history    Past Surgical History:  Procedure Laterality Date   CESAREAN SECTION  2012   CESAREAN SECTION  08/14/2012   Procedure: CESAREAN SECTION;  Surgeon: Frederico Hamman, MD;  Location: [facility];  Service: Obstetrics;  Laterality: N/A;   CESAREAN SECTION  2009   LAPAROSCOPIC APPENDECTOMY N/A 02/25/2019   Procedure: APPENDECTOMY LAPAROSCOPIC;  Surgeon: Erroll Luna, MD;  Location: [facility];  Service: General;  Laterality: N/A;   SINUS ENDO WITH FUSION Right 03/06/2020   Procedure: RIGHT SINUS ENDO WITH FUSION;  Surgeon: Jerrell Belfast, MD;  Location: [facility];  Service: ENT;  Laterality: Right;   WISDOM TOOTH EXTRACTION     Social History   Socioeconomic History   Marital status: Married    Spouse name: Danaka Llera   Number of children: 3   Years of education: Not on file   Highest education level: Not on file  Occupational History   Not on file  Tobacco Use   Smoking status: Never   Smokeless tobacco: Never  Substance and Sexual Activity   Alcohol use: No   Drug use: No   Sexual activity: Yes    Birth control/protection: None  Other Topics Concern   Not on file  Social History Narrative   Not on file   Social Determinants of Health   Financial Resource Strain: Not on file  Food Insecurity: Not on file  Transportation Needs: Not on file  Physical Activity: Not on file  Stress: Not on file   Social Connections: Not on file  Intimate Partner Violence: Not on file     Medications: No outpatient medications prior to visit.   No facility-administered medications prior to visit.    Review of Systems Review of Systems:  A fourteen system review of systems was performed and found to be positive as per HPI.   Last CBC Lab Results  Component Value Date   WBC 4.0 03/03/2022   HGB 11.9 03/03/2022   HCT 37.4 03/03/2022   MCV 85 03/03/2022   MCH 26.9 03/03/2022   RDW 13.1 03/03/2022   PLT 314 64/33/2951   Last metabolic panel Lab Results  Component Value Date   GLUCOSE 96 03/03/2022   NA 141 03/03/2022   K 4.2 03/03/2022   CL 106 03/03/2022   CO2 21 03/03/2022   BUN 12 03/03/2022   CREATININE 0.72 03/03/2022   EGFR 115 03/03/2022   CALCIUM 9.2 03/03/2022   PROT 6.8 03/03/2022   ALBUMIN 4.4 03/03/2022   LABGLOB 2.4 03/03/2022   AGRATIO 1.8 03/03/2022   BILITOT 0.3 03/03/2022   ALKPHOS 53 03/03/2022   AST 14 03/03/2022   ALT 12 03/03/2022   ANIONGAP 5 02/26/2019   Last lipids Lab Results  Component Value Date   CHOL 178 03/03/2022   HDL 82 03/03/2022   LDLCALC 87 03/03/2022   TRIG 46  03/03/2022   CHOLHDL 2.2 03/03/2022   Last hemoglobin A1c Lab Results  Component Value Date   HGBA1C 5.7 (H) 03/03/2022   Last thyroid functions Lab Results  Component Value Date   TSH 1.460 03/03/2022   Last vitamin D No results found for: 25OHVITD2, 25OHVITD3, VD25OH  Objective     BP 103/68   Pulse 88   Temp 97.7 F (36.5 C)   Ht 5' 5"  (1.651 m)   Wt 153 lb (69.4 kg)   LMP  (LMP Unknown)   SpO2 100%   BMI 25.46 kg/m  BP Readings from Last 3 Encounters:  03/10/22 103/68  01/19/22 98/64  03/06/20 107/73   Wt Readings from Last 3 Encounters:  03/10/22 153 lb (69.4 kg)  01/19/22 152 lb (68.9 kg)  03/06/20 150 lb 3.2 oz (68.1 kg)    Physical Exam   General Appearance:       Alert, cooperative, in no acute distress, appears stated age   Head:     Normocephalic, without obvious abnormality, atraumatic  Eyes:    PERRL, conjunctiva/corneas clear, EOM's intact, fundi    benign, both eyes  Ears:    Normal TM's and external ear canals, both ears  Nose:   Nares normal, septum midline, mucosa normal, no drainage    or sinus tenderness  Throat:   Lips, mucosa, and tongue normal; teeth and gums normal  Neck:   Supple, symmetrical, trachea midline, no adenopathy;    thyroid:  no enlargement/tenderness/nodules; no carotid   bruit or JVD  Back:     Symmetric, no curvature, ROM normal, no CVA tenderness  Lungs:     Clear to auscultation bilaterally, respirations unlabored  Chest Wall:    No tenderness or deformity   Heart:    Normal heart rate. Normal rhythm. No murmurs, rubs, or gallops.   Breast Exam:    No nipple retraction or dimpling, Normal to palpation without dominant masses  Abdomen:     Soft, tenderness of lower abdomen, bowel sounds active all four quadrants,    no masses, no organomegaly  Pelvic:    positive findings: tender uterus or vaginal discharge:  white and malodorous and uterus normal size, shape, and consistency  Extremities:   All extremities are intact. No cyanosis or edema  Pulses:   2+ and symmetric all extremities  Skin:   Skin color, texture, turgor normal, no rashes or lesions  Lymph nodes:   Cervical, supraclavicular, and axillary nodes normal  Neurologic:   CNII-XII grossly intact.     Last depression screening scores    03/10/2022    4:04 PM 01/19/2022    9:55 AM  PHQ 2/9 Scores  PHQ - 2 Score 0 0  PHQ- 9 Score 0 0   Last fall risk screening    03/10/2022    4:03 PM  Fall Risk   Falls in the past year? 0  Number falls in past yr: 0  Injury with Fall? 0  Risk for fall due to : No Fall Risks  Follow up Falls evaluation completed     Results for orders placed or performed in visit on 03/10/22  POCT urinalysis dipstick  Result Value Ref Range   Color, UA yellow    Clarity, UA cloudy    Glucose,  UA Negative Negative   Bilirubin, UA negative    Ketones, UA negative    Spec Grav, UA 1.020 1.010 - 1.025   Blood, UA negative    pH, UA  7.0 5.0 - 8.0   Protein, UA Negative Negative   Urobilinogen, UA 1.0 0.2 or 1.0 E.U./dL   Nitrite, UA positive    Leukocytes, UA Negative Negative   Appearance cloudy/turbid    Odor present     Assessment & Plan    Routine Health Maintenance and Physical Exam  Exercise Activities and Dietary recommendations -Heart healthy diet low in fat and carbohydrates. Recommend moderate exercise 150 mins/wk.  Immunization History  Administered Date(s) Administered   Influenza Split 08/15/2012   PFIZER(Purple Top)SARS-COV-2 Vaccination 05/11/2020, 06/01/2020   Tdap 03/10/2022    Health Maintenance  Topic Date Due   HIV Screening  Never done   Hepatitis C Screening  Never done   COVID-19 Vaccine (3 - Booster for Pfizer series) 07/27/2020   PAP SMEAR-Modifier  03/02/2021   INFLUENZA VACCINE  05/10/2022   TETANUS/TDAP  03/10/2032   HPV VACCINES  Aged Out    Discussed health benefits of physical activity, and encouraged her to engage in regular exercise appropriate for her age and condition.  Problem List Items Addressed This Visit   None Visit Diagnoses     Encounter for general adult medical examination with abnormal findings    -  Primary   Relevant Orders   Cytology - PAP( [place])   Need for Tdap vaccination       Relevant Orders   Tdap vaccine greater than or equal to 7yo IM (Completed)   Abnormal urine odor       Relevant Orders   POCT urinalysis dipstick (Completed)   Cystitis       Relevant Medications   nitrofurantoin, macrocrystal-monohydrate, (MACROBID) 100 MG capsule   Other Relevant Orders   Urine Culture   Dyspareunia in female       Relevant Orders   US Pelvic Complete With Transvaginal   Pelvic pain       Relevant Orders   US Pelvic Complete With Transvaginal   Vaginal discharge          Discussed with patient  most recent lab results which are essentially within normal limits with the exception of A1c which is mildly elevated at 5.7, prediabetes. Discussed diet and lifestyle changes.  On exam vaginal odor noted and patient also reports noticing malodorous urine so UA collected and positive for nitrites, will send for culture and start empiric antibiotic therapy with Macrobid 100 mg BID x 5 days. Pap collected. Will also place order for transvaginal ultrasound due to hx of dyspareunia and extreme pelvic tenderness on exam today.   Patient agreeable to Tdap.    Return in about 1 year (around 03/11/2023) for CPE and FBW.       Lorrene Reid, PA-C  [facility] [phone] (phone) [phone] (fax)  [place]

## 2022-03-10 NOTE — Patient Instructions (Signed)

## 2022-03-11 ENCOUNTER — Telehealth: Payer: Self-pay | Admitting: Physician Assistant

## 2022-03-11 NOTE — Telephone Encounter (Signed)
Patient was referred to [facility] to have an ultrasound done however they called her and they do not accept her insurance. Can the order be placed to [facility] so she can get it done there?

## 2022-03-13 LAB — URINE CULTURE

## 2022-03-14 NOTE — Telephone Encounter (Signed)
Location has been changed. AS, CMA

## 2022-03-15 LAB — CYTOLOGY - PAP
Adequacy: ABSENT
Comment: NEGATIVE
Diagnosis: UNDETERMINED — AB
High risk HPV: NEGATIVE

## 2022-05-24 ENCOUNTER — Other Ambulatory Visit: Payer: Self-pay | Admitting: Physician Assistant

## 2022-05-24 DIAGNOSIS — N3090 Cystitis, unspecified without hematuria: Secondary | ICD-10-CM

## 2022-10-28 ENCOUNTER — Other Ambulatory Visit: Payer: Self-pay | Admitting: Internal Medicine

## 2022-10-28 MED ORDER — SULFAMETHOXAZOLE-TRIMETHOPRIM 400-80 MG PO TABS: 1.0000 | ORAL_TABLET | Freq: Two times a day (BID) | ORAL | 1 refills | Status: AC

## 2022-11-20 ENCOUNTER — Other Ambulatory Visit: Payer: Self-pay | Admitting: Internal Medicine

## 2022-11-20 MED ORDER — AMOXICILLIN 500 MG PO TABS: 500.0000 mg | ORAL_TABLET | Freq: Two times a day (BID) | ORAL | 1 refills | Status: AC

## 2023-01-09 ENCOUNTER — Other Ambulatory Visit: Payer: Self-pay | Admitting: Internal Medicine

## 2023-01-09 MED ORDER — AZITHROMYCIN 1 G PO PACK: 1.0000 g | PACK | Freq: Once | ORAL | 0 refills | Status: DC

## 2023-01-09 MED ORDER — AZITHROMYCIN 500 MG PO TABS: 1000.0000 mg | ORAL_TABLET | Freq: Once | ORAL | 1 refills | Status: DC

## 2023-01-13 ENCOUNTER — Other Ambulatory Visit: Payer: Self-pay | Admitting: Internal Medicine

## 2023-01-13 MED ORDER — AZITHROMYCIN 250 MG PO TABS: ORAL_TABLET | ORAL | 0 refills | Status: DC

## 2023-01-18 ENCOUNTER — Other Ambulatory Visit: Payer: Self-pay | Admitting: Internal Medicine

## 2023-01-18 MED ORDER — AZITHROMYCIN 250 MG PO TABS: ORAL_TABLET | ORAL | 0 refills | Status: DC

## 2023-02-02 ENCOUNTER — Ambulatory Visit: Payer: Self-pay | Admitting: Cardiology

## 2023-02-02 VITALS — HR 90

## 2023-02-02 DIAGNOSIS — R0789 Other chest pain: Secondary | ICD-10-CM

## 2023-02-28 ENCOUNTER — Other Ambulatory Visit: Payer: Self-pay

## 2023-02-28 MED ORDER — AMOXICILLIN 500 MG PO CAPS: 500.0000 mg | ORAL_CAPSULE | Freq: Three times a day (TID) | ORAL | 3 refills | Status: DC

## 2023-03-11 ENCOUNTER — Encounter: Payer: Self-pay | Admitting: Internal Medicine

## 2023-03-11 MED ORDER — SULFAMETHOXAZOLE-TRIMETHOPRIM 800-160 MG PO TABS: 1.0000 | ORAL_TABLET | Freq: Every day | ORAL | 0 refills | Status: AC

## 2023-04-17 ENCOUNTER — Encounter: Payer: Self-pay | Admitting: *Deleted

## 2023-04-17 NOTE — Progress Notes (Signed)
Pt seen at 03/11/23 screening event where her b/p was 112/76. At the event, pt shared that she was changing her PCP from the currently listed [facility] PCP to be closer to where she lived and was given a [facility] flyer. Pt did not identify any SDOH insecurities at the event. Per pt discussion via phone call today, pt states she is still waiting for her job's new insurance to take effect, and that she had already looked at the [employer] [facility] Now flyer (which she still had) and found some PCP closer to her, that she intended to call to get established as soon as her new insurance took effect. Pt denied any immediate healthcare access or needs at this time.

## 2023-06-20 NOTE — Progress Notes (Signed)
Error

## 2023-06-21 ENCOUNTER — Encounter: Payer: Self-pay | Admitting: *Deleted

## 2023-06-21 NOTE — Progress Notes (Signed)
Pt attended 03/11/23 screening event where her b/p was 112/76. At the event and during the initial event f/u, the pt shared that she was changing jobs and waiting for insurance to take effect before changing her PCP. During this 60 day event f/u, pt stated she is coming to work for [employer] but has not yet gotten insurance and still has access to her former PCP still listed in [facility] in the interim, if needed, for healthcare. She also stated she still has the [facility] and [employer] to use when she is ready to make an appt to get established with her new PCP.

## 2023-09-21 ENCOUNTER — Encounter: Payer: Self-pay | Admitting: *Deleted

## 2023-09-21 NOTE — Progress Notes (Signed)
Pt attended 03/11/23 screening event where her b/p was 112/76. At the event the pt shared she was uninsured, needed a new PCP, did not smoke, and did not identify any SDOH insecurities. During the initial and 60 day event f/u calls, pt shared she was starting a new job and would get insurance, so she was waiting for the insurance to choose a PCP- both times pt was sent PCP info to consider. Chart review today does not reveal any [facility]-visible PCP or other healthcare encounters since the event and health equity team member unable to contact pt by phone today (VM left) so final letter offering [facility], [facility] info, including her former PCP office contact info([facility] - Maritiza Abonza PA-C no longer shown as working at [employer]), in case of interest to pt. No additional health equity team support scheduled at this time.

## 2024-01-30 ENCOUNTER — Other Ambulatory Visit: Payer: Self-pay | Admitting: Cardiology

## 2024-01-30 DIAGNOSIS — K047 Periapical abscess without sinus: Secondary | ICD-10-CM

## 2024-01-30 MED ORDER — AMOXICILLIN-POT CLAVULANATE 500-125 MG PO TABS: 1.0000 | ORAL_TABLET | Freq: Three times a day (TID) | ORAL | 0 refills | Status: AC

## 2024-01-30 NOTE — Progress Notes (Signed)
 ICD-10-CM   1. Abscessed tooth  K04.7 amoxicillin -clavulanate (AUGMENTIN ) 500-125 MG tablet      Meds ordered this encounter  Medications   amoxicillin -clavulanate (AUGMENTIN ) 500-125 MG tablet    Sig: Take 1 tablet by mouth 3 (three) times daily. With food    Dispense:  21 tablet    Refill:  0   She has a right lower tooth that is chipped and has cavity, she already has an appointment to see a dentist soon but in view of full smell, suspect she may have tooth decay/abscess, will Rx Augmentin .  Patient is not allergic to any of the components.     Knox Perl, MD, [facility] 01/30/2024, 4:55 PM [facility] [address] [place], [place] [address] Phone: [phone]. Fax:  [phone]

## 2024-07-22 ENCOUNTER — Other Ambulatory Visit (HOSPITAL_COMMUNITY): Payer: Self-pay

## 2024-07-22 MED ORDER — FLUZONE 0.5 ML IM SUSY
0.5000 mL | PREFILLED_SYRINGE | Freq: Once | INTRAMUSCULAR | 0 refills | Status: AC
  Filled 2024-07-22: qty 0.5, 1d supply, fill #0

## 2024-08-08 ENCOUNTER — Encounter: Payer: Self-pay | Admitting: Family Medicine
